# Patient Record
Sex: Male | Born: 1950 | Race: Black or African American | Hispanic: No | Smoking: Never smoker
Health system: Southern US, Community
[De-identification: ages and names within clinical notes are randomized; demographics above are authoritative.]

## PROBLEM LIST (undated history)

## (undated) DIAGNOSIS — N400 Enlarged prostate without lower urinary tract symptoms: Secondary | ICD-10-CM

## (undated) DIAGNOSIS — E119 Type 2 diabetes mellitus without complications: Secondary | ICD-10-CM

## (undated) DIAGNOSIS — I10 Essential (primary) hypertension: Secondary | ICD-10-CM

---

## 2016-11-25 HISTORY — PX: CORONARY ARTERY BYPASS GRAFT: SHX141

## 2017-01-02 ENCOUNTER — Inpatient Hospital Stay (HOSPITAL_COMMUNITY)
Admission: EM | Admit: 2017-01-02 | Discharge: 2017-01-09 | DRG: 304 | Disposition: A | Discharge status: Home / Self Care | Payer: Medicare Other | Attending: Internal Medicine | Admitting: Internal Medicine

## 2017-01-02 ENCOUNTER — Encounter (HOSPITAL_COMMUNITY): Payer: Self-pay

## 2017-01-02 ENCOUNTER — Emergency Department (HOSPITAL_COMMUNITY): Payer: Medicare Other

## 2017-01-02 DIAGNOSIS — Z823 Family history of stroke: Secondary | ICD-10-CM | POA: Diagnosis not present

## 2017-01-02 DIAGNOSIS — I5032 Chronic diastolic (congestive) heart failure: Secondary | ICD-10-CM | POA: Diagnosis present

## 2017-01-02 DIAGNOSIS — N179 Acute kidney failure, unspecified: Secondary | ICD-10-CM | POA: Diagnosis present

## 2017-01-02 DIAGNOSIS — Z79899 Other long term (current) drug therapy: Secondary | ICD-10-CM

## 2017-01-02 DIAGNOSIS — Z7984 Long term (current) use of oral hypoglycemic drugs: Secondary | ICD-10-CM

## 2017-01-02 DIAGNOSIS — E1165 Type 2 diabetes mellitus with hyperglycemia: Secondary | ICD-10-CM | POA: Diagnosis present

## 2017-01-02 DIAGNOSIS — F19930 Other psychoactive substance use, unspecified with withdrawal, uncomplicated: Secondary | ICD-10-CM

## 2017-01-02 DIAGNOSIS — I130 Hypertensive heart and chronic kidney disease with heart failure and stage 1 through stage 4 chronic kidney disease, or unspecified chronic kidney disease: Secondary | ICD-10-CM | POA: Diagnosis present

## 2017-01-02 DIAGNOSIS — Z951 Presence of aortocoronary bypass graft: Secondary | ICD-10-CM | POA: Diagnosis not present

## 2017-01-02 DIAGNOSIS — E872 Acidosis, unspecified: Secondary | ICD-10-CM | POA: Diagnosis present

## 2017-01-02 DIAGNOSIS — R1110 Vomiting, unspecified: Secondary | ICD-10-CM | POA: Diagnosis not present

## 2017-01-02 DIAGNOSIS — I2510 Atherosclerotic heart disease of native coronary artery without angina pectoris: Secondary | ICD-10-CM | POA: Diagnosis present

## 2017-01-02 DIAGNOSIS — F19230 Other psychoactive substance dependence with withdrawal, uncomplicated: Secondary | ICD-10-CM

## 2017-01-02 DIAGNOSIS — D649 Anemia, unspecified: Secondary | ICD-10-CM | POA: Diagnosis present

## 2017-01-02 DIAGNOSIS — Z8249 Family history of ischemic heart disease and other diseases of the circulatory system: Secondary | ICD-10-CM | POA: Diagnosis not present

## 2017-01-02 DIAGNOSIS — N183 Chronic kidney disease, stage 3 unspecified: Secondary | ICD-10-CM | POA: Diagnosis present

## 2017-01-02 DIAGNOSIS — K226 Gastro-esophageal laceration-hemorrhage syndrome: Secondary | ICD-10-CM | POA: Diagnosis present

## 2017-01-02 DIAGNOSIS — E876 Hypokalemia: Secondary | ICD-10-CM | POA: Diagnosis present

## 2017-01-02 DIAGNOSIS — Z888 Allergy status to other drugs, medicaments and biological substances status: Secondary | ICD-10-CM | POA: Diagnosis not present

## 2017-01-02 DIAGNOSIS — I160 Hypertensive urgency: Secondary | ICD-10-CM | POA: Diagnosis present

## 2017-01-02 DIAGNOSIS — K2970 Gastritis, unspecified, without bleeding: Secondary | ICD-10-CM | POA: Diagnosis present

## 2017-01-02 DIAGNOSIS — K5900 Constipation, unspecified: Secondary | ICD-10-CM | POA: Diagnosis present

## 2017-01-02 DIAGNOSIS — Z833 Family history of diabetes mellitus: Secondary | ICD-10-CM | POA: Diagnosis not present

## 2017-01-02 DIAGNOSIS — E1122 Type 2 diabetes mellitus with diabetic chronic kidney disease: Secondary | ICD-10-CM | POA: Diagnosis present

## 2017-01-02 DIAGNOSIS — R110 Nausea: Secondary | ICD-10-CM

## 2017-01-02 DIAGNOSIS — Z841 Family history of disorders of kidney and ureter: Secondary | ICD-10-CM

## 2017-01-02 DIAGNOSIS — R000 Tachycardia, unspecified: Secondary | ICD-10-CM

## 2017-01-02 DIAGNOSIS — R112 Nausea with vomiting, unspecified: Secondary | ICD-10-CM | POA: Diagnosis present

## 2017-01-02 DIAGNOSIS — J310 Chronic rhinitis: Secondary | ICD-10-CM | POA: Diagnosis present

## 2017-01-02 DIAGNOSIS — N400 Enlarged prostate without lower urinary tract symptoms: Secondary | ICD-10-CM | POA: Diagnosis present

## 2017-01-02 DIAGNOSIS — Z7902 Long term (current) use of antithrombotics/antiplatelets: Secondary | ICD-10-CM | POA: Diagnosis not present

## 2017-01-02 DIAGNOSIS — J9811 Atelectasis: Secondary | ICD-10-CM | POA: Diagnosis present

## 2017-01-02 DIAGNOSIS — I10 Essential (primary) hypertension: Secondary | ICD-10-CM

## 2017-01-02 HISTORY — DX: Benign prostatic hyperplasia without lower urinary tract symptoms: N40.0

## 2017-01-02 HISTORY — DX: Essential (primary) hypertension: I10

## 2017-01-02 HISTORY — DX: Type 2 diabetes mellitus without complications: E11.9

## 2017-01-02 LAB — URINALYSIS, ROUTINE W REFLEX MICROSCOPIC
Bacteria, UA: NONE SEEN
Bilirubin Urine: NEGATIVE
Glucose, UA: >=500 mg/dL — AB
KETONES UR: 20 mg/dL — AB
Leukocytes, UA: NEGATIVE
Nitrite: NEGATIVE
PH: 5 (ref 5.0–8.0)
Protein, ur: >=300 mg/dL — AB
Specific Gravity, Urine: 1.016 (ref 1.005–1.030)
Squamous Epithelial / LPF: NONE SEEN

## 2017-01-02 LAB — COMPREHENSIVE METABOLIC PANEL
ALK PHOS: 81 U/L (ref 38–126)
ALT: 14 U/L — ABNORMAL LOW (ref 17–63)
ANION GAP: 23 — AB (ref 5–15)
AST: 27 U/L (ref 15–41)
Albumin: 4.2 g/dL (ref 3.5–5.0)
BUN: 16 mg/dL (ref 6–20)
CALCIUM: 10 mg/dL (ref 8.9–10.3)
CO2: 19 mmol/L — AB (ref 22–32)
Chloride: 98 mmol/L — ABNORMAL LOW (ref 101–111)
Creatinine, Ser: 1.89 mg/dL — ABNORMAL HIGH (ref 0.61–1.24)
GFR calc non Af Amer: 35 mL/min — ABNORMAL LOW (ref >60)
GFR, EST AFRICAN AMERICAN: 41 mL/min — AB (ref >60)
Glucose, Bld: 446 mg/dL — ABNORMAL HIGH (ref 65–99)
POTASSIUM: 3.6 mmol/L (ref 3.5–5.1)
SODIUM: 140 mmol/L (ref 135–145)
Total Bilirubin: 1.1 mg/dL (ref 0.3–1.2)
Total Protein: 8.9 g/dL — ABNORMAL HIGH (ref 6.5–8.1)

## 2017-01-02 LAB — CBC
HCT: 36.3 % — ABNORMAL LOW (ref 39.0–52.0)
HEMOGLOBIN: 12.2 g/dL — AB (ref 13.0–17.0)
MCH: 28.8 pg (ref 26.0–34.0)
MCHC: 33.6 g/dL (ref 30.0–36.0)
MCV: 85.6 fL (ref 78.0–100.0)
Platelets: 428 10*3/uL — ABNORMAL HIGH (ref 150–400)
RBC: 4.24 MIL/uL (ref 4.22–5.81)
RDW: 15.3 % (ref 11.5–15.5)
WBC: 9.6 10*3/uL (ref 4.0–10.5)

## 2017-01-02 LAB — I-STAT TROPONIN, ED: Troponin i, poc: 0.03 ng/mL (ref 0.00–0.08)

## 2017-01-02 LAB — CBG MONITORING, ED: GLUCOSE-CAPILLARY: 294 mg/dL — AB (ref 65–99)

## 2017-01-02 LAB — I-STAT CG4 LACTIC ACID, ED
LACTIC ACID, VENOUS: 2.12 mmol/L — AB (ref 0.5–1.9)
Lactic Acid, Venous: 4.3 mmol/L (ref 0.5–1.9)

## 2017-01-02 MED ORDER — SODIUM CHLORIDE 0.9 % IV SOLN: 250.0000 mL | INTRAVENOUS | Status: DC | PRN

## 2017-01-02 MED ORDER — LABETALOL HCL 5 MG/ML IV SOLN
10.0000 mg | INTRAVENOUS | Status: DC | PRN
  Administered 2017-01-02: 10 mg via INTRAVENOUS
  Administered 2017-01-03 (×2): 20 mg via INTRAVENOUS
  Filled 2017-01-02 (×3): qty 4

## 2017-01-02 MED ORDER — LACTATED RINGERS IV SOLN
INTRAVENOUS | Status: DC
  Administered 2017-01-02: 17:00:00 via INTRAVENOUS
  Filled 2017-01-02: qty 1000

## 2017-01-02 MED ORDER — SODIUM CHLORIDE 0.9 % IV BOLUS (SEPSIS)
1000.0000 mL | Freq: Once | INTRAVENOUS | Status: AC
  Administered 2017-01-02: 1000 mL via INTRAVENOUS

## 2017-01-02 MED ORDER — SODIUM CHLORIDE 0.9 % IV SOLN
INTRAVENOUS | Status: DC
  Administered 2017-01-02: 20:00:00 via INTRAVENOUS

## 2017-01-02 MED ORDER — CLONIDINE HCL 0.2 MG PO TABS
0.2000 mg | ORAL_TABLET | Freq: Three times a day (TID) | ORAL | Status: DC
Start: 2017-01-02 — End: 2017-01-04
  Administered 2017-01-02 – 2017-01-04 (×4): 0.2 mg via ORAL
  Filled 2017-01-02 (×6): qty 1

## 2017-01-02 MED ORDER — INSULIN REGULAR BOLUS VIA INFUSION
0.0000 [IU] | Freq: Three times a day (TID) | INTRAVENOUS | Status: DC
  Filled 2017-01-02: qty 10

## 2017-01-02 MED ORDER — LACTATED RINGERS IV BOLUS (SEPSIS)
1000.0000 mL | Freq: Once | INTRAVENOUS | Status: AC
  Administered 2017-01-02: 1000 mL via INTRAVENOUS

## 2017-01-02 MED ORDER — METOPROLOL TARTRATE 5 MG/5ML IV SOLN
5.0000 mg | Freq: Once | INTRAVENOUS | Status: AC
  Administered 2017-01-02: 5 mg via INTRAVENOUS
  Filled 2017-01-02: qty 5

## 2017-01-02 MED ORDER — HYDRALAZINE HCL 20 MG/ML IJ SOLN
10.0000 mg | Freq: Once | INTRAMUSCULAR | Status: DC
Start: 2017-01-02 — End: 2017-01-02
  Filled 2017-01-02: qty 1

## 2017-01-02 MED ORDER — DEXTROSE 50 % IV SOLN: 25.0000 mL | INTRAVENOUS | Status: DC | PRN

## 2017-01-02 MED ORDER — LACTATED RINGERS IV BOLUS (SEPSIS): 1000.0000 mL | Freq: Once | INTRAVENOUS | Status: DC

## 2017-01-02 MED ORDER — SENNOSIDES-DOCUSATE SODIUM 8.6-50 MG PO TABS
2.0000 | ORAL_TABLET | Freq: Every evening | ORAL | Status: DC | PRN
  Filled 2017-01-02 (×2): qty 2

## 2017-01-02 MED ORDER — ACETAMINOPHEN 325 MG PO TABS: 650.0000 mg | ORAL_TABLET | Freq: Four times a day (QID) | ORAL | Status: DC | PRN

## 2017-01-02 MED ORDER — SODIUM CHLORIDE 0.9 % IV SOLN
8.0000 mg/h | INTRAVENOUS | Status: DC
  Administered 2017-01-02 – 2017-01-03 (×2): 8 mg/h via INTRAVENOUS
  Filled 2017-01-02 (×3): qty 80

## 2017-01-02 MED ORDER — SODIUM CHLORIDE 0.9 % IV SOLN
INTRAVENOUS | Status: DC
  Administered 2017-01-03: 01:00:00 via INTRAVENOUS

## 2017-01-02 MED ORDER — ONDANSETRON HCL 4 MG/2ML IJ SOLN
4.0000 mg | Freq: Once | INTRAMUSCULAR | Status: AC
  Administered 2017-01-02: 4 mg via INTRAVENOUS
  Filled 2017-01-02: qty 2

## 2017-01-02 MED ORDER — METOPROLOL TARTRATE 25 MG PO TABS: 100.0000 mg | ORAL_TABLET | Freq: Two times a day (BID) | ORAL | Status: DC

## 2017-01-02 MED ORDER — DEXTROSE-NACL 5-0.45 % IV SOLN: INTRAVENOUS | Status: DC

## 2017-01-02 MED ORDER — LISINOPRIL 5 MG PO TABS: 5.0000 mg | ORAL_TABLET | Freq: Every day | ORAL | Status: DC

## 2017-01-02 MED ORDER — SODIUM CHLORIDE 0.9 % IV SOLN
INTRAVENOUS | Status: DC
  Filled 2017-01-02: qty 1

## 2017-01-02 MED ORDER — INSULIN ASPART 100 UNIT/ML ~~LOC~~ SOLN
0.0000 [IU] | SUBCUTANEOUS | Status: DC
  Administered 2017-01-03: 1 [IU] via SUBCUTANEOUS
  Administered 2017-01-03: 7 [IU] via SUBCUTANEOUS
  Administered 2017-01-03: 5 [IU] via SUBCUTANEOUS

## 2017-01-02 MED ORDER — LORATADINE 10 MG PO TABS
10.0000 mg | ORAL_TABLET | Freq: Every day | ORAL | Status: DC | PRN
  Filled 2017-01-02: qty 1

## 2017-01-02 MED ORDER — FLUTICASONE PROPIONATE 50 MCG/ACT NA SUSP
1.0000 | Freq: Every day | NASAL | Status: DC | PRN
  Filled 2017-01-02: qty 16

## 2017-01-02 MED ORDER — SODIUM CHLORIDE 0.9 % IV SOLN
80.0000 mg | Freq: Once | INTRAVENOUS | Status: AC
  Administered 2017-01-02: 80 mg via INTRAVENOUS
  Filled 2017-01-02: qty 80

## 2017-01-02 MED ORDER — ONDANSETRON HCL 4 MG/2ML IJ SOLN
4.0000 mg | Freq: Four times a day (QID) | INTRAMUSCULAR | Status: DC | PRN
  Administered 2017-01-04 (×2): 4 mg via INTRAVENOUS
  Filled 2017-01-02 (×4): qty 2

## 2017-01-02 MED ORDER — CLONIDINE HCL 0.2 MG PO TABS: 0.3000 mg | ORAL_TABLET | Freq: Once | ORAL | Status: DC

## 2017-01-02 MED ORDER — SODIUM CHLORIDE 0.9% FLUSH
3.0000 mL | Freq: Two times a day (BID) | INTRAVENOUS | Status: DC
  Administered 2017-01-04 – 2017-01-08 (×10): 3 mL via INTRAVENOUS

## 2017-01-02 MED ORDER — LABETALOL HCL 5 MG/ML IV SOLN
0.5000 mg/min | INTRAVENOUS | Status: DC
  Administered 2017-01-02: 2 mg/min via INTRAVENOUS
  Administered 2017-01-03: 0.5 mg/min via INTRAVENOUS
  Administered 2017-01-03: 2.5 mg/min via INTRAVENOUS
  Filled 2017-01-02 (×3): qty 100

## 2017-01-02 MED ORDER — LABETALOL HCL 5 MG/ML IV SOLN
10.0000 mg | INTRAVENOUS | Status: DC | PRN
  Administered 2017-01-02: 10 mg via INTRAVENOUS
  Filled 2017-01-02: qty 4

## 2017-01-02 MED ORDER — HYDRALAZINE HCL 50 MG PO TABS: 50.0000 mg | ORAL_TABLET | Freq: Three times a day (TID) | ORAL | Status: DC

## 2017-01-02 MED ORDER — METOPROLOL TARTRATE 5 MG/5ML IV SOLN: 5.0000 mg | Freq: Once | INTRAVENOUS | Status: DC

## 2017-01-02 MED ORDER — HYDRALAZINE HCL 20 MG/ML IJ SOLN
10.0000 mg | Freq: Once | INTRAMUSCULAR | Status: DC
  Filled 2017-01-02: qty 1

## 2017-01-02 MED ORDER — HYDRALAZINE HCL 50 MG PO TABS
50.0000 mg | ORAL_TABLET | Freq: Three times a day (TID) | ORAL | Status: DC
  Administered 2017-01-03: 50 mg via ORAL
  Filled 2017-01-02 (×2): qty 1

## 2017-01-02 NOTE — Progress Notes (Addendum)
Now failed Labetalol 10mg  IVPush x2, DBP still 130s, HR 117.  Spoke with pharmd.  Will try labetalol gtt, and resuming clonadine but at lower dose of 0.2 TID (instead of 0.3 TID which was stopped 4 days ago).

## 2017-01-02 NOTE — ED Triage Notes (Signed)
Pt had CABG done at [facility] in May. He has been vomiting X2 days. Pt is very tachycardic in triage. Appears weak in appearance.

## 2017-01-02 NOTE — ED Notes (Signed)
Per daughter pt has been sipping water with no further emesis.  Per Dr. Julian ReilGardner pt is to have PO Clonidine them possibly d/c  Labetalol drip.

## 2017-01-02 NOTE — Consult Note (Signed)
PULMONARY / CRITICAL CARE MEDICINE   Name: Brian Grimes MRN: [id number] DOB: May 27, 1951    ADMISSION DATE:  01/02/2017 CONSULTATION DATE:  01/02/2017  REFERRING MD:  Dr. 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 Upon arrival to ED patient HR 146, BP 201/134, WBC 9.6, Lactic Acid 4.3, Glucose 446, Anion Gap 23, +Ketones. In ED had one reported episode of coffee ground emesis. Patient reports that he went to CT Surgery Follow up at [facility] on 6/21 where his BP was 82/47, he was instructed to stop clonidine and check BP before taking metoprolol. However, for the last 2 days patient has been unable to take BP medications due to nausea. In ED given 2L LR and 5 mg metoprolol, 20 mg Labetolol, and 0.2 mg Clonidine, and initially admitted to [facility], however BP still uncontrolled so placed on Labetalol gtt. PCCM to transfer to ICU.   PAST MEDICAL HISTORY :  He  has a past medical history of BPH (benign prostatic hyperplasia); Diabetes mellitus without complication (HCC); and Hypertension.  PAST SURGICAL HISTORY: He  has a past surgical history that includes Coronary artery bypass graft (11/25/2016).  Allergies  Allergen Reactions  . Lipitor [Atorvastatin] Other (See Comments)    Muscle aches (legs)  . Pollen Extract Other (See Comments)    Runny nose, runny eyes, sneezing, etc..  . Pravastatin Other (See Comments)    Muscle aches (legs)    No current facility-administered medications on file prior to encounter.    No current outpatient prescriptions on file prior to encounter.    FAMILY HISTORY:  His indicated that the status of his mother is unknown. He indicated that the status of his father is unknown. He indicated that the status of his brother is unknown.     SOCIAL HISTORY: He  reports that he has never smoked. He has never used smokeless tobacco. He reports that he does not drink alcohol.  REVIEW OF SYSTEMS:   All negative; except for those that are bolded, which indicate positives.  Constitutional: weight loss, weight gain, night sweats, fevers, chills, fatigue, weakness.  HEENT: headaches, sore throat, sneezing, nasal congestion, post nasal drip, difficulty swallowing, tooth/dental problems, visual complaints, visual changes, ear aches. Neuro: difficulty with speech, weakness, numbness, ataxia. CV:  chest pain, orthopnea, PND, swelling in lower extremities, dizziness, palpitations, syncope.  Resp: cough, hemoptysis, dyspnea, wheezing. GI: heartburn, indigestion, abdominal pain, nausea, vomiting, diarrhea, constipation, change in bowel habits, loss of appetite, hematemesis, melena, hematochezia.  GU: dysuria, change in color of urine, urgency or frequency, flank pain, hematuria. MSK: joint pain or swelling, decreased range of motion. Psych: change in mood or affect, depression, anxiety, suicidal ideations, homicidal ideations. Skin: rash, itching, bruising.   SUBJECTIVE:  Denies Chest/ABD Pain.   VITAL SIGNS: BP (!) 183/122   Pulse (!) 110   Temp 97.5 F (36.4 C) (Oral)   Resp (!) 21   SpO2 98%   HEMODYNAMICS:    VENTILATOR SETTINGS:    INTAKE / OUTPUT: I/O last 3 completed shifts: In: 2000 [I.V.:2000] Out: -   PHYSICAL EXAMINATION: General:  Adult male, no distress  Neuro:  Alert, oriented, grossly intact   HEENT: Dry MM Cardiovascular:  Tachy, no MRG Lungs:  Clear  breath sounds, non-labored  Abdomen:  Non-tender, active bowel sounds  Musculoskeletal:  -edema  Skin:  Warm, dry, intact   LABS:  BMET  Recent Labs Lab 01/02/17 1428  NA 140  K 3.6  CL 98*  CO2 19*  BUN 16  CREATININE 1.89*  GLUCOSE 446*    Electrolytes  Recent Labs Lab 01/02/17 1428  CALCIUM 10.0    CBC  Recent Labs Lab  01/02/17 1428  WBC 9.6  HGB 12.2*  HCT 36.3*  PLT 428*    Coag's No results for input(s): APTT, INR in the last 168 hours.  Sepsis Markers  Recent Labs Lab 01/02/17 1427 01/02/17 1838  LATICACIDVEN 4.30* 2.12*    ABG No results for input(s): PHART, PCO2ART, PO2ART in the last 168 hours.  Liver Enzymes  Recent Labs Lab 01/02/17 1428  AST 27  ALT 14*  ALKPHOS 81  BILITOT 1.1  ALBUMIN 4.2    Cardiac Enzymes No results for input(s): TROPONINI, PROBNP in the last 168 hours.  Glucose  Recent Labs Lab 01/02/17 2012  GLUCAP 294*    Imaging Dg Chest Portable 1 View  Result Date: 01/02/2017 CLINICAL DATA:  Two days of nausea and vomiting. Tachycardia. History of diabetes and coronary artery disease. CABG in May 2018. EXAM: PORTABLE CHEST 1 VIEW COMPARISON:  None in PACs FINDINGS: The left lung is well-expanded and clear. On the right there is mild volume loss. Linear increased density in the right perihilar and infrahilar region is present. There is blunting of the right lateral costophrenic angle. The heart and pulmonary vascularity are normal. There is calcification in the wall of the aortic arch. The sternal wires are intact. IMPRESSION: Right perihilar and basilar subsegmental atelectasis. Small right pleural effusion versus pleural thickening. No pulmonary edema. Previous CABG. Thoracic aortic atherosclerosis. Electronically Signed   By: David  SwazilandJordan M.D.   On: 01/02/2017 16:05    STUDIES:  EKG 6/25 >> ST 150, prolonged QTc 530 PCXR 6/25 >> right perihilar and basilar subsegmental atelectasis; small right pleural effusion vs pleural thickening; previous CABG; thoracic aortic atherosclerosis   [facility] >>  ANTIBIOTICS: n/a  SIGNIFICANT EVENTS: 6/25  Admit   LINES/TUBES: PIV   DISCUSSION: [age] yoM with recent CABG in May presented with N/V x 2 days and HTN urgency.    ASSESSMENT / PLAN:  PULMONARY A: Right atelectasis P:   Pulmonary  hygiene with IS  CARDIOVASCULAR A:  HTN urgency from no home HTN meds for 2 days Tachycardia - ? Rebound from stopping clonidine 6/21 vs QTc prolongation Hx [facility]) P:  ICU monitoring Trend troponin  Labetalol gtt for goal reduction of 25%- SBP 150-160 Continue conidine 0.2 mg TID Continue apresoline 50 mg q 8 EKG in am  Avoid QTc prolonging meds Holding plavix   RENAL A:   AKI (baseline sCr 1.5) AGMA/ Lactic acidosis - ddx to N/V, metformin, ?hypovolemia 2/2 decreased oral intake  CKD stage III - s/p 2 Liter NS in ED  P:   NS at 75 ml/hr Trend BMP / urinary output Replace electrolytes as indicated Avoid nephrotoxic agents, ensure adequate renal perfusion Trending Lactic Acid   GASTROINTESTINAL A:   R/o UGIB 2/2 small amount of coffee ground emesis vs ? Mallory weiss tear - normal LFTs P:   NPO except for meds On protonix gtt for now pending Gastroccult  Consider GI consult in am  Caution with zofran with prolonged QTc  HEMATOLOGIC A:   Anemia- mild  P:  Trend CBC SCDs  INFECTIOUS A:   No clear source- afebrile, normal WBC  P:   Follow BC PCT for completeness   ENDOCRINE A:   DM - w/ AG/ +ketones P:   SSI sensitive CBG q 4 Hold Metformin   NEUROLOGIC A:   No acute issues  P:   Monitor    FAMILY  - Updates: Family updated at bedside   - Inter-disciplinary family meet or Palliative Care meeting due by:  01/09/2017   CC Time: 45 minutes   Pulmonary and Critical Care Medicine [facility] Pager: [phone]  01/02/2017, 10:36 PM

## 2017-01-02 NOTE — ED Provider Notes (Signed)
[facility]-EMERGENCY DEPT Provider Note   CSN: [id number] Arrival date & time: 01/02/17  1354     History   Chief Complaint Chief Complaint  Patient presents with  . Emesis    HPI Brian Grimes is a 66 y.o. male.  The history is provided by the patient, the spouse and medical records. No language interpreter was used.  Emesis   This is a new problem. The current episode started 2 days ago. The problem occurs 5 to 10 times per day. The problem has not changed since onset.There has been no fever. Pertinent negatives include no abdominal pain, no arthralgias, no chills, no cough, no diarrhea and no fever.    Past Medical History:  Diagnosis Date  . BPH (benign prostatic hyperplasia)   . Diabetes mellitus without complication (HCC)   . Hypertension     Patient Active Problem List   Diagnosis Date Noted  . CKD stage 3 due to type 2 diabetes mellitus (HCC) 01/02/2017  . Hypertensive urgency 01/02/2017  . Nausea & vomiting 01/02/2017  . Lactic acidosis 01/02/2017  . Hyperglycemia due to type 2 diabetes mellitus (HCC) 01/02/2017  . CAD (coronary artery disease), native coronary artery 01/02/2017    Past Surgical History:  Procedure Laterality Date  . CORONARY ARTERY BYPASS GRAFT  11/25/2016       Home Medications    Prior to Admission medications   Medication Sig Start Date End Date Taking? Authorizing Provider  clopidogrel (PLAVIX) 75 MG tablet Take 75 mg by mouth daily.   Yes [provider]  fluticasone (FLONASE ALLERGY RELIEF) 50 MCG/ACT nasal spray Place 1 spray into both nostrils daily as needed for allergies or rhinitis.   Yes [provider]  glipiZIDE (GLUCOTROL) 5 MG tablet Take 2.5 mg by mouth daily before breakfast.   Yes [provider]  hydrALAZINE (APRESOLINE) 50 MG tablet Take 50 mg by mouth every 8 (eight) hours.   Yes [provider]  lisinopril (PRINIVIL,ZESTRIL) 5 MG tablet Take 5 mg by mouth daily.   Yes [provider]  loratadine (CLARITIN) 10 MG tablet Take 10 mg by mouth daily as needed for allergies or rhinitis.   Yes [provider]  metFORMIN (GLUCOPHAGE) 1000 MG tablet Take 1,000 mg by mouth 2 (two) times daily with a meal.   Yes [provider]  metoprolol tartrate (LOPRESSOR) 100 MG tablet Take 100 mg by mouth 2 (two) times daily.   Yes [provider]  sennosides-docusate sodium (SENOKOT-S) 8.6-50 MG tablet Take 2 tablets by mouth at bedtime as needed for constipation.   Yes [provider]    Family History Family History  Problem Relation Age of Onset  . Diabetes Mother   . Kidney disease Mother   . Hypertension Mother   . Stroke Father   . Kidney disease Brother     Social History Social History  Substance Use Topics  . Smoking status: Never Smoker  . Smokeless tobacco: Never Used  . Alcohol use No     Allergies   Lipitor [atorvastatin]; Pollen extract; and Pravastatin   Review of Systems Review of Systems  Constitutional: Positive for fatigue. Negative for chills and fever.  HENT: Negative for ear pain and sore throat.   Eyes: Negative for pain and visual disturbance.  Respiratory: Negative for cough and shortness of breath.   Cardiovascular: Negative for chest pain and palpitations.  Gastrointestinal: Positive for vomiting. Negative for abdominal pain and diarrhea.  Genitourinary: Negative for  dysuria and hematuria.  Musculoskeletal: Negative for arthralgias and back pain.  Skin: Negative for color change and rash.  Neurological: Negative for seizures and syncope.  All other systems reviewed and are negative.    Physical Exam Updated Vital Signs BP (!) 150/102   Pulse 98   Temp 99.9 F (37.7 C) (Oral)   Resp (!) 25   Ht 6\' 2"  (1.88 m)   Wt 87.2 kg (192 lb 3.9 oz)   SpO2 99%   BMI 24.68 kg/m   Physical Exam  Constitutional: He appears well-developed. He appears ill.  HENT:  Head: Normocephalic and  atraumatic.  Eyes: Conjunctivae are normal.  Neck: Neck supple.  Cardiovascular: Regular rhythm.  Tachycardia present.   No murmur heard. Pulmonary/Chest: Effort normal and breath sounds normal. No respiratory distress. He has no wheezes.  Abdominal: Soft. He exhibits no distension. There is no tenderness. There is no guarding.  Musculoskeletal: He exhibits no edema.  Neurological: He is alert. No cranial nerve deficit. Coordination normal.  5/5 motor strength and intact sensation in all extremities. Intact bilateral finger-to-nose coordination  Skin: Skin is warm and dry.  Nursing note and vitals reviewed.    ED Treatments / Results  Labs (all labs ordered are listed, but only abnormal results are displayed) Labs Reviewed  COMPREHENSIVE METABOLIC PANEL - Abnormal; Notable for the following:       Result Value   Chloride 98 (*)    CO2 19 (*)    Glucose, Bld 446 (*)    Creatinine, Ser 1.89 (*)    Total Protein 8.9 (*)    ALT 14 (*)    GFR calc non Af Amer 35 (*)    GFR calc Af Amer 41 (*)    Anion gap 23 (*)    All other components within normal limits  CBC - Abnormal; Notable for the following:    Hemoglobin 12.2 (*)    HCT 36.3 (*)    Platelets 428 (*)    All other components within normal limits  URINALYSIS, ROUTINE W REFLEX MICROSCOPIC - Abnormal; Notable for the following:    Color, Urine STRAW (*)    Glucose, UA >=500 (*)    Hgb urine dipstick MODERATE (*)    Ketones, ur 20 (*)    Protein, ur >=300 (*)    All other components within normal limits  CBC - Abnormal; Notable for the following:    RBC 4.01 (*)    Hemoglobin 11.4 (*)    HCT 34.5 (*)    All other components within normal limits  MAGNESIUM - Abnormal; Notable for the following:    Magnesium 1.3 (*)    All other components within normal limits  TROPONIN I - Abnormal; Notable for the following:    Troponin I 0.08 (*)    All other components within normal limits  GLUCOSE, CAPILLARY - Abnormal; Notable  for the following:    Glucose-Capillary 329 (*)    All other components within normal limits  I-STAT CG4 LACTIC ACID, ED - Abnormal; Notable for the following:    Lactic Acid, Venous 4.30 (*)    All other components within normal limits  I-STAT CG4 LACTIC ACID, ED - Abnormal; Notable for the following:    Lactic Acid, Venous 2.12 (*)    All other components within normal limits  CBG MONITORING, ED - Abnormal; Notable for the following:    Glucose-Capillary 294 (*)    All other components within normal limits  POCT GASTRIC  OCCULT BLOOD (1-CARD TO LAB) - Abnormal; Notable for the following:    Occult Blood, Gastric POSITIVE (*)    All other components within normal limits  CULTURE, BLOOD (ROUTINE X 2)  CULTURE, BLOOD (ROUTINE X 2)  URINE CULTURE  MRSA PCR SCREENING  PROCALCITONIN  LACTIC ACID, PLASMA  CBC  BASIC METABOLIC PANEL  TROPONIN I  TROPONIN I  MAGNESIUM  PHOSPHORUS  PROCALCITONIN  OCCULT BLOOD GASTRIC / DUODENUM (SPECIMEN CUP)  Rosezena Sensor, ED    EKG  EKG Interpretation  Date/Time:  Monday January 02 2017 14:19:52 EDT Ventricular Rate:  150 PR Interval:    QRS Duration: 69 QT Interval:  326 QTC Calculation: 515 R Axis:   54 Text Interpretation:  Sinus tachycardia Ventricular premature complex Aberrant complex Probable LVH with secondary repol abnrm Baseline wander Abnormal ekg Confirmed by Gerhard Munch [phone]) on 01/02/2017 3:44:21 PM       Radiology Dg Chest Portable 1 View  Result Date: 01/02/2017 CLINICAL DATA:  Two days of nausea and vomiting. Tachycardia. History of diabetes and coronary artery disease. CABG in May 2018. EXAM: PORTABLE CHEST 1 VIEW COMPARISON:  None in PACs FINDINGS: The left lung is well-expanded and clear. On the right there is mild volume loss. Linear increased density in the right perihilar and infrahilar region is present. There is blunting of the right lateral costophrenic angle. The heart and pulmonary vascularity are normal.  There is calcification in the wall of the aortic arch. The sternal wires are intact. IMPRESSION: Right perihilar and basilar subsegmental atelectasis. Small right pleural effusion versus pleural thickening. No pulmonary edema. Previous CABG. Thoracic aortic atherosclerosis. Electronically Signed   By: David  [place]az[place] M.D.   On: 01/02/2017 16:05    Procedures Procedures (including critical care time)  Medications Ordered in ED Medications  pantoprazole (PROTONIX) 80 mg in sodium chloride 0.9 % 250 mL (0.32 mg/mL) infusion (8 mg/hr Intravenous Rate/Dose Verify 01/03/17 0000)  dextrose 50 % solution 25 mL (not administered)  ondansetron (ZOFRAN) injection 4 mg (not administered)  acetaminophen (TYLENOL) tablet 650 mg (not administered)  sodium chloride flush (NS) 0.9 % injection 3 mL (not administered)  lisinopril (PRINIVIL,ZESTRIL) tablet 5 mg (not administered)  senna-docusate (Senokot-S) tablet 2 tablet (not administered)  loratadine (CLARITIN) tablet 10 mg (not administered)  fluticasone (FLONASE) 50 MCG/ACT nasal spray 1 spray (not administered)  labetalol (NORMODYNE,TRANDATE) injection 10-20 mg (10 mg Intravenous Given 01/02/17 2020)  insulin aspart (novoLOG) injection 0-9 Units (7 Units Subcutaneous Given 01/03/17 0019)  0.9 %  sodium chloride infusion ( Intravenous New Bag/Given 01/03/17 0121)  hydrALAZINE (APRESOLINE) tablet 50 mg (not administered)  cloNIDine (CATAPRES) tablet 0.2 mg (0.2 mg Oral Given 01/02/17 2215)  labetalol (NORMODYNE,TRANDATE) 500 mg in dextrose 5 % 125 mL (4 mg/mL) infusion (2.5 mg/min Intravenous New Bag/Given 01/03/17 0116)  0.9 %  sodium chloride infusion (not administered)  magnesium sulfate IVPB 2 g 50 mL (not administered)  sodium chloride 0.9 % bolus 1,000 mL (0 mLs Intravenous Stopped 01/02/17 1638)  ondansetron (ZOFRAN) injection 4 mg (4 mg Intravenous Given 01/02/17 1526)  lactated ringers bolus 1,000 mL (0 mLs Intravenous Stopped 01/02/17 1639)  pantoprazole  (PROTONIX) 80 mg in sodium chloride 0.9 % 100 mL IVPB (0 mg Intravenous Stopped 01/02/17 1743)  metoprolol tartrate (LOPRESSOR) injection 5 mg (5 mg Intravenous Given 01/02/17 1744)  ondansetron (ZOFRAN) injection 4 mg (4 mg Intravenous Given 01/02/17 1957)     Initial Impression / Assessment and Plan / ED Course  I  have reviewed the triage vital signs and the nursing notes.  Pertinent labs & imaging results that were available during my care of the patient were reviewed by me and considered in my medical decision making (see chart for details).     66 year old male history of diabetes, hypertension, CAD S/P CABG who presents with family with 2 days of nausea and vomiting.  He is found to be hypertensive and tachycardic, likely secondary to clonidine withdrawal and recent discontinuation of hypertensive medications.  Patient endorses 2 days of emesis.  Today, vomitus appeared to have brown tinge.  Denies previous episodes of GI bleeding.  Does not currently take anticoagulation.  He recently had CABG in May 2018 at [facility].  He was seen in CT surgery clinic 4 days ago and noted to be hypotensive to 80s/40s.  He was immediately taken off his clonidine 0.3 mg 3 times daily and evening dose of metoprolol.  He denies any fevers, cough, diarrhea, chest pain, shortness breath, dysuria, or rash.  AF, tachycardic 150s, hypertensive 190s/100s. Lungs CTAB. Abdomen soft, benign throughout.  Initial lactic acid 4.30. IVF ordered. HR remains in 150s. No obvious source of infection. Protonix given for brown discoloration of emesis. Repeat lactic acid 2.1. Crt 1.89, uptrending from OSH baseline 1.3-1.6.   Metoprolol given for persistent tachycardia and hypertension. No significant improvement. Suspect withdrawal from abrupt discontinuation of clonidine several days ago. Patient admitted for further management and evaluation. Pt stable at time of transfer.  Pt care d/w Dr. Jeraldine Loots  Final Clinical  Impressions(s) / ED Diagnoses   Final diagnoses:  Acute drug withdrawal syndrome without complication [facility])  Accelerated hypertension  Tachycardia    New Prescriptions Current Discharge Medication List       Hebert Soho, MD 01/04/17 1144    Gerhard Munch, MD 01/07/17 0003

## 2017-01-02 NOTE — ED Notes (Signed)
Repeat ekg requested by dr Jeraldine Lootslockwood

## 2017-01-02 NOTE — ED Notes (Signed)
Attempted to stick patient to obtain blood cultures x2 unsuccessfully

## 2017-01-02 NOTE — H&P (Signed)
History and Physical    Brian Grimes ZOX:[id number] DOB: 1951/07/07 DOA: 01/02/2017  PCP: System, Pcp Not In  Patient coming from: Home  I have personally briefly reviewed patient's old medical records in [facility]  Chief Complaint: N/V  HPI: Brian Grimes is a 66 y.o. male with medical history significant of CABG done last month [facility].  Saw his CVTS x4 days ago in office with generalized weakness, lightheadedness.  Found to have hypotension with BP 87/45, HR 75 at that time.  Adjustments made to BP meds.  Became ill yesterday with NV.  No BP meds at all last night or this AM.  Presents to ED.  Symptoms severe, persistent, nothing makes better or worse.  No CP, no SOB.    ED Course: Initial lactate 4.3, down to 2.1 after 2L LR.  BGL 446.  BP running 200s/140s with HR 140s.  Got 5 of metoprolol with only short effect.  Single episode of coffee ground appearing emesis now in ED, small amount.  Got empirically started on PPI GTT, HGB 12.2.   Review of Systems: As per HPI otherwise 10 point review of systems negative.   Past Medical History:  Diagnosis Date  . BPH (benign prostatic hyperplasia)   . Diabetes mellitus without complication (HCC)   . Hypertension     Past Surgical History:  Procedure Laterality Date  . CORONARY ARTERY BYPASS GRAFT  11/25/2016     reports that he has never smoked. He has never used smokeless tobacco. He reports that he does not drink alcohol. His drug history is not on file.  Allergies  Allergen Reactions  . Lipitor [Atorvastatin] Other (See Comments)    Muscle aches (legs)  . Pollen Extract Other (See Comments)    Runny nose, runny eyes, sneezing, etc..  . Pravastatin Other (See Comments)    Muscle aches (legs)    Family History  Problem Relation Age of Onset  . Diabetes Mother   . Kidney disease Mother   . Hypertension Mother   . Stroke Father   . Kidney disease Brother      Prior to Admission medications     Medication Sig Start Date End Date Taking? Authorizing Provider  clopidogrel (PLAVIX) 75 MG tablet Take 75 mg by mouth daily.   Yes [provider]  fluticasone (FLONASE ALLERGY RELIEF) 50 MCG/ACT nasal spray Place 1 spray into both nostrils daily as needed for allergies or rhinitis.   Yes [provider]  glipiZIDE (GLUCOTROL) 5 MG tablet Take 2.5 mg by mouth daily before breakfast.   Yes [provider]  hydrALAZINE (APRESOLINE) 50 MG tablet Take 50 mg by mouth every 8 (eight) hours.   Yes [provider]  lisinopril (PRINIVIL,ZESTRIL) 5 MG tablet Take 5 mg by mouth daily.   Yes [provider]  loratadine (CLARITIN) 10 MG tablet Take 10 mg by mouth daily as needed for allergies or rhinitis.   Yes [provider]  metFORMIN (GLUCOPHAGE) 1000 MG tablet Take 1,000 mg by mouth 2 (two) times daily with a meal.   Yes [provider]  metoprolol tartrate (LOPRESSOR) 100 MG tablet Take 100 mg by mouth 2 (two) times daily.   Yes [provider]  sennosides-docusate sodium (SENOKOT-S) 8.6-50 MG tablet Take 2 tablets by mouth at bedtime as needed for constipation.   Yes [provider]    Physical Exam: Vitals:   01/02/17 1915 01/02/17 1945 01/02/17 1952 01/02/17 1955  BP: Marland Kitchen)  195/134 (!) 198/137 (!) 199/125 (!) 179/122  Pulse: (!) 139 (!) 139 (!) 137 (!) 123  Resp: (!) 22 (!) 31 18 (!) 25  Temp:      TempSrc:      SpO2: 98% 97% 100% 99%    Constitutional: NAD, calm, comfortable Eyes: PERRL, lids and conjunctivae normal ENMT: Mucous membranes are moist. Posterior pharynx clear of any exudate or lesions.Normal dentition.  Neck: normal, supple, no masses, no thyromegaly Respiratory: clear to auscultation bilaterally, no wheezing, no crackles. Normal respiratory effort. No accessory muscle use.  Cardiovascular: Regular rate and rhythm, no murmurs / rubs / gallops. No extremity edema. 2+ pedal pulses. No carotid  bruits.  Abdomen: no tenderness, no masses palpated. No hepatosplenomegaly. Bowel sounds positive.  Musculoskeletal: no clubbing / cyanosis. No joint deformity upper and lower extremities. Good ROM, no contractures. Normal muscle tone.  Skin: no rashes, lesions, ulcers. No induration, Healing sternotomy incision, no erythema, discharge or other signs of infection. Neurologic: CN 2-12 grossly intact. Sensation intact, DTR normal. Strength 5/5 in all 4.  Psychiatric: Normal judgment and insight. Alert and oriented x 3. Normal mood.    Labs on Admission: I have personally reviewed following labs and imaging studies  CBC:  Recent Labs Lab 01/02/17 1428  WBC 9.6  HGB 12.2*  HCT 36.3*  MCV 85.6  PLT 428*   Basic Metabolic Panel:  Recent Labs Lab 01/02/17 1428  NA 140  K 3.6  CL 98*  CO2 19*  GLUCOSE 446*  BUN 16  CREATININE 1.89*  CALCIUM 10.0   GFR: CrCl cannot be calculated (Unknown ideal weight.). Liver Function Tests:  Recent Labs Lab 01/02/17 1428  AST 27  ALT 14*  ALKPHOS 81  BILITOT 1.1  PROT 8.9*  ALBUMIN 4.2   No results for input(s): LIPASE, AMYLASE in the last 168 hours. No results for input(s): AMMONIA in the last 168 hours. Coagulation Profile: No results for input(s): INR, PROTIME in the last 168 hours. Cardiac Enzymes: No results for input(s): CKTOTAL, CKMB, CKMBINDEX, TROPONINI in the last 168 hours. BNP (last 3 results) No results for input(s): PROBNP in the last 8760 hours. HbA1C: No results for input(s): HGBA1C in the last 72 hours. CBG: No results for input(s): GLUCAP in the last 168 hours. Lipid Profile: No results for input(s): CHOL, HDL, LDLCALC, TRIG, CHOLHDL, LDLDIRECT in the last 72 hours. Thyroid Function Tests: No results for input(s): TSH, T4TOTAL, FREET4, T3FREE, THYROIDAB in the last 72 hours. Anemia Panel: No results for input(s): VITAMINB12, FOLATE, FERRITIN, TIBC, IRON, RETICCTPCT in the last 72 hours. Urine analysis:     Component Value Date/Time   COLORURINE STRAW (A) 01/02/2017 1853   APPEARANCEUR CLEAR 01/02/2017 1853   LABSPEC 1.016 01/02/2017 1853   PHURINE 5.0 01/02/2017 1853   GLUCOSEU >=500 (A) 01/02/2017 1853   HGBUR MODERATE (A) 01/02/2017 1853   BILIRUBINUR NEGATIVE 01/02/2017 1853   KETONESUR 20 (A) 01/02/2017 1853   PROTEINUR >=300 (A) 01/02/2017 1853   NITRITE NEGATIVE 01/02/2017 1853   LEUKOCYTESUR NEGATIVE 01/02/2017 1853    Radiological Exams on Admission: Dg Chest Portable 1 View  Result Date: 01/02/2017 CLINICAL DATA:  Two days of nausea and vomiting. Tachycardia. History of diabetes and coronary artery disease. CABG in May 2018. EXAM: PORTABLE CHEST 1 VIEW COMPARISON:  None in PACs FINDINGS: The left lung is well-expanded and clear. On the right there is mild volume loss. Linear increased density in the right perihilar and infrahilar region is present. There is  blunting of the right lateral costophrenic angle. The heart and pulmonary vascularity are normal. There is calcification in the wall of the aortic arch. The sternal wires are intact. IMPRESSION: Right perihilar and basilar subsegmental atelectasis. Small right pleural effusion versus pleural thickening. No pulmonary edema. Previous CABG. Thoracic aortic atherosclerosis. Electronically Signed   By: David  SwazilandJordan M.D.   On: 01/02/2017 16:05    EKG: Independently reviewed.  Assessment/Plan Principal Problem:   Hypertensive urgency Active Problems:   CKD stage 3 due to type 2 diabetes mellitus (HCC)   Nausea vomiting and diarrhea   Lactic acidosis   Hyperglycemia due to type 2 diabetes mellitus (HCC)   CAD (coronary artery disease), native coronary artery    1. HTN urgency - with tachycardia 1. Likely secondary to adjustments of BP meds within the past couple of days, possibly rebound hypertension due to stopping clonidine (had been on 0.3mg  Q8H prior to the 21st). 2. PRN labetalol 1. 10mg  had some effect, trying another  10 now 2. If this fails then may need GTT 3. Resume home BP meds 4. Tele monitor 2. Hyperglycemia with DM2 - 1. SSI Q4H 2. Repeat BMP in AM 3. Lactic acidosis - 1. Possibly secondary to #1 above 2. Cleared with IVF 3. Holding metformin 4. BCx pending, but infection felt less likely as primary cause given profound hypertension 4. N/V - 1. Zofran prn 2. Sending for gastrocult 3. If negative then stop PPI gtt and resume plavix (held for moment) 4. If positive then continue this, possibly call GI in AM, suspect mallory-weis though 5. CAD - 1. Holding plavix due to possible UGIB 2. No recent stents, just had CABG last month 6. CKD stage 3 - slight elevation from baseline likely due to dehydration secondary to N/V, looks like he typically runs creat of 1.5 or so.  DVT prophylaxis: SCDs - possible UGIB with hematemesis Code Status: Full Family Communication: Family at bedside Disposition Plan: Home after admit Consults called: None Admission status: Admit to inpatient   Hillary BowGARDNER, JARED M. DO [facility] Pager [phone]  If 7AM-7PM, please contact day team taking care of patient [email or web address] Password [email or web address]  01/02/2017, 7:59 PM

## 2017-01-02 NOTE — ED Notes (Signed)
Dr. Silverio LayYao made aware of patient's HR in the 150s

## 2017-01-03 ENCOUNTER — Inpatient Hospital Stay (HOSPITAL_COMMUNITY): Payer: Medicare Other

## 2017-01-03 LAB — GASTRIC OCCULT BLOOD (1-CARD TO LAB): Occult Blood, Gastric: POSITIVE — AB

## 2017-01-03 LAB — GLUCOSE, CAPILLARY
GLUCOSE-CAPILLARY: 221 mg/dL — AB (ref 65–99)
GLUCOSE-CAPILLARY: 329 mg/dL — AB (ref 65–99)
GLUCOSE-CAPILLARY: 329 mg/dL — AB (ref 65–99)
Glucose-Capillary: 259 mg/dL — ABNORMAL HIGH (ref 65–99)
Glucose-Capillary: 219 mg/dL — ABNORMAL HIGH (ref 65–99)
Glucose-Capillary: 250 mg/dL — ABNORMAL HIGH (ref 65–99)

## 2017-01-03 LAB — CBC
HCT: 33.7 % — ABNORMAL LOW (ref 39.0–52.0)
HCT: 34.5 % — ABNORMAL LOW (ref 39.0–52.0)
HEMOGLOBIN: 10.9 g/dL — AB (ref 13.0–17.0)
HEMOGLOBIN: 11.4 g/dL — AB (ref 13.0–17.0)
MCH: 28.2 pg (ref 26.0–34.0)
MCH: 28.4 pg (ref 26.0–34.0)
MCHC: 32.3 g/dL (ref 30.0–36.0)
MCHC: 33 g/dL (ref 30.0–36.0)
MCV: 86 fL (ref 78.0–100.0)
MCV: 87.1 fL (ref 78.0–100.0)
PLATELETS: 359 10*3/uL (ref 150–400)
PLATELETS: 359 10*3/uL (ref 150–400)
RBC: 3.87 MIL/uL — AB (ref 4.22–5.81)
RBC: 4.01 MIL/uL — AB (ref 4.22–5.81)
RDW: 15.3 % (ref 11.5–15.5)
RDW: 15.7 % — ABNORMAL HIGH (ref 11.5–15.5)
WBC: 10.6 10*3/uL — AB (ref 4.0–10.5)
WBC: 10.2 10*3/uL (ref 4.0–10.5)

## 2017-01-03 LAB — BASIC METABOLIC PANEL
ANION GAP: 10 (ref 5–15)
BUN: 16 mg/dL (ref 6–20)
CHLORIDE: 105 mmol/L (ref 101–111)
CO2: 26 mmol/L (ref 22–32)
Calcium: 8.9 mg/dL (ref 8.9–10.3)
Creatinine, Ser: 1.76 mg/dL — ABNORMAL HIGH (ref 0.61–1.24)
GFR, EST AFRICAN AMERICAN: 44 mL/min — AB (ref >60)
GFR, EST NON AFRICAN AMERICAN: 38 mL/min — AB (ref >60)
Glucose, Bld: 277 mg/dL — ABNORMAL HIGH (ref 65–99)
POTASSIUM: 3.3 mmol/L — AB (ref 3.5–5.1)
SODIUM: 141 mmol/L (ref 135–145)

## 2017-01-03 LAB — LACTIC ACID, PLASMA: LACTIC ACID, VENOUS: 1.6 mmol/L (ref 0.5–1.9)

## 2017-01-03 LAB — TROPONIN I
Troponin I: 0.08 ng/mL (ref <0.03)
Troponin I: 0.08 ng/mL (ref <0.03)
Troponin I: 0.07 ng/mL (ref <0.03)

## 2017-01-03 LAB — POCT GASTRIC OCCULT BLOOD (1-CARD TO LAB)

## 2017-01-03 LAB — MAGNESIUM
MAGNESIUM: 1.3 mg/dL — AB (ref 1.7–2.4)
Magnesium: 1.5 mg/dL — ABNORMAL LOW (ref 1.7–2.4)

## 2017-01-03 LAB — PROCALCITONIN

## 2017-01-03 LAB — MRSA PCR SCREENING: MRSA by PCR: NEGATIVE

## 2017-01-03 LAB — PHOSPHORUS: PHOSPHORUS: 3.3 mg/dL (ref 2.5–4.6)

## 2017-01-03 MED ORDER — PANTOPRAZOLE SODIUM 40 MG IV SOLR
40.0000 mg | Freq: Two times a day (BID) | INTRAVENOUS | Status: DC
  Administered 2017-01-03 – 2017-01-08 (×11): 40 mg via INTRAVENOUS
  Filled 2017-01-03 (×11): qty 40

## 2017-01-03 MED ORDER — MAGNESIUM SULFATE 4 GM/100ML IV SOLN
4.0000 g | Freq: Once | INTRAVENOUS | Status: DC
  Filled 2017-01-03: qty 100

## 2017-01-03 MED ORDER — INSULIN ASPART 100 UNIT/ML ~~LOC~~ SOLN
0.0000 [IU] | Freq: Every day | SUBCUTANEOUS | Status: DC
  Administered 2017-01-03: 4 [IU] via SUBCUTANEOUS
  Administered 2017-01-05: 3 [IU] via SUBCUTANEOUS
  Administered 2017-01-07: 4 [IU] via SUBCUTANEOUS

## 2017-01-03 MED ORDER — MAGNESIUM SULFATE 2 GM/50ML IV SOLN
2.0000 g | Freq: Once | INTRAVENOUS | Status: AC
  Administered 2017-01-03: 2 g via INTRAVENOUS

## 2017-01-03 MED ORDER — METOPROLOL TARTRATE 50 MG PO TABS
100.0000 mg | ORAL_TABLET | Freq: Two times a day (BID) | ORAL | Status: DC
  Administered 2017-01-03 – 2017-01-04 (×2): 100 mg via ORAL
  Filled 2017-01-03: qty 2
  Filled 2017-01-03 (×2): qty 1

## 2017-01-03 MED ORDER — INSULIN ASPART 100 UNIT/ML ~~LOC~~ SOLN
0.0000 [IU] | Freq: Three times a day (TID) | SUBCUTANEOUS | Status: DC
  Administered 2017-01-03 (×2): 5 [IU] via SUBCUTANEOUS
  Administered 2017-01-04: 3 [IU] via SUBCUTANEOUS
  Administered 2017-01-04: 2 [IU] via SUBCUTANEOUS
  Administered 2017-01-04 – 2017-01-05 (×2): 8 [IU] via SUBCUTANEOUS
  Administered 2017-01-05: 5 [IU] via SUBCUTANEOUS
  Administered 2017-01-05: 3 [IU] via SUBCUTANEOUS
  Administered 2017-01-06: 15 [IU] via SUBCUTANEOUS
  Administered 2017-01-06: 8 [IU] via SUBCUTANEOUS
  Administered 2017-01-07: 11 [IU] via SUBCUTANEOUS
  Administered 2017-01-08: 3 [IU] via SUBCUTANEOUS
  Administered 2017-01-08 (×2): 11 [IU] via SUBCUTANEOUS
  Administered 2017-01-09: 5 [IU] via SUBCUTANEOUS
  Administered 2017-01-09: 2 [IU] via SUBCUTANEOUS

## 2017-01-03 MED ORDER — HYDRALAZINE HCL 50 MG PO TABS
100.0000 mg | ORAL_TABLET | Freq: Three times a day (TID) | ORAL | Status: DC
  Administered 2017-01-03 – 2017-01-04 (×3): 100 mg via ORAL
  Filled 2017-01-03 (×5): qty 2

## 2017-01-03 MED ORDER — MAGNESIUM SULFATE 2 GM/50ML IV SOLN
2.0000 g | Freq: Once | INTRAVENOUS | Status: DC
  Filled 2017-01-03: qty 50

## 2017-01-03 MED ORDER — HYDRALAZINE HCL 20 MG/ML IJ SOLN
20.0000 mg | INTRAMUSCULAR | Status: DC | PRN
  Administered 2017-01-03 – 2017-01-04 (×2): 20 mg via INTRAVENOUS
  Filled 2017-01-03 (×2): qty 1

## 2017-01-03 MED ORDER — POTASSIUM CHLORIDE CRYS ER 20 MEQ PO TBCR
40.0000 meq | EXTENDED_RELEASE_TABLET | Freq: Once | ORAL | Status: AC
  Administered 2017-01-03: 40 meq via ORAL
  Filled 2017-01-03: qty 2

## 2017-01-03 NOTE — Progress Notes (Signed)
PULMONARY / CRITICAL CARE MEDICINE   Name: Brian Grimes MRN: [id number] DOB: 09-28-1950    ADMISSION DATE:  01/02/2017 CONSULTATION DATE:  01/02/2017  REFERRING MD:  Dr. 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 Upon arrival to ED patient HR 146, BP 201/134, WBC 9.6, Lactic Acid 4.3, Glucose 446, Anion Gap 23, +Ketones. In ED had one reported episode of coffee ground emesis. Patient reports that he went to CT Surgery Follow up at [facility] on 6/21 where his BP was 82/47, he was instructed to stop clonidine and check BP before taking metoprolol. However, for the last 2 days patient has been unable to take BP medications due to nausea. In ED given 2L LR and 5 mg metoprolol, 20 mg Labetolol, and 0.2 mg Clonidine, and initially admitted to [facility], however BP still uncontrolled so placed on Labetalol gtt. PCCM to transfer to ICU.    SUBJECTIVE:  Denies pain   VITAL SIGNS: BP (!) 158/96   Pulse 98   Temp 98.4 F (36.9 C) (Oral)   Resp 16   Ht 6\' 2"  (1.88 m)   Wt 192 lb 3.9 oz (87.2 kg)   SpO2 99%   BMI 24.68 kg/m  Room air  HEMODYNAMICS:    VENTILATOR SETTINGS:    INTAKE / OUTPUT: I/O last 3 completed shifts: In: 3032.6 [I.V.:2982.6; IV Piggyback:50] Out: 750 [Urine:750]  Physical Exam  Constitutional: He is oriented to person, place, and time. He appears well-developed. No distress.  HENT:  Head: Normocephalic and atraumatic.  Eyes: Conjunctivae are normal. Pupils are equal, round, and reactive to light.  Neck: Normal range of motion. Neck supple. No JVD present.  Cardiovascular: Normal rate, regular rhythm and normal heart sounds.   Pulmonary/Chest: Effort normal and breath sounds normal. No respiratory distress. He has no wheezes. He has no rales.   Abdominal: Soft. Bowel sounds are normal. He exhibits no distension.  Musculoskeletal: Normal range of motion. He exhibits no edema.  Neurological: He is alert and oriented to person, place, and time.  Skin: Skin is warm and dry. Capillary refill takes less than 2 seconds. He is not diaphoretic. No erythema.  Psychiatric: He has a normal mood and affect.   LABS:  BMET  Recent Labs Lab 01/02/17 1428 01/03/17 0419  NA 140 141  K 3.6 3.3*  CL 98* 105  CO2 19* 26  BUN 16 16  CREATININE 1.89* 1.76*  GLUCOSE 446* 277*    Electrolytes  Recent Labs Lab 01/02/17 1428 01/03/17 0032 01/03/17 0419  CALCIUM 10.0  --  8.9  MG  --  1.3* 1.5*  PHOS  --   --  3.3    CBC  Recent Labs Lab 01/02/17 1428 01/03/17 0032 01/03/17 0419  WBC 9.6 10.2 10.6*  HGB 12.2* 11.4* 10.9*  HCT 36.3* 34.5* 33.7*  PLT 428* 359 359    Coag's No results for input(s): APTT, INR in the last 168 hours.  Sepsis Markers  Recent Labs Lab 01/02/17 1427 01/02/17 1838 01/03/17 0032 01/03/17 0419  LATICACIDVEN 4.30* 2.12* 1.6  --   PROCALCITON  --   --  <0.10 <0.10    ABG No results for input(s): PHART, PCO2ART, PO2ART in the last 168 hours.  Liver Enzymes  Recent Labs Lab 01/02/17 1428  AST 27  ALT 14*  ALKPHOS 81  BILITOT 1.1  ALBUMIN 4.2    Cardiac Enzymes  Recent Labs Lab 01/03/17 0032 01/03/17 0419  TROPONINI 0.08* 0.08*    Glucose  Recent Labs Lab 01/02/17 2012 01/03/17 0007 01/03/17 0303  GLUCAP 294* 329* 259*    Imaging Dg Chest Portable 1 View  Result Date: 01/02/2017 CLINICAL DATA:  Two days of nausea and vomiting. Tachycardia. History of diabetes and coronary artery disease. CABG in May 2018. EXAM: PORTABLE CHEST 1 VIEW COMPARISON:  None in PACs FINDINGS: The left lung is well-expanded and clear. On the right there is mild volume loss. Linear increased density in the right perihilar and infrahilar region is present. There is blunting of the right lateral  costophrenic angle. The heart and pulmonary vascularity are normal. There is calcification in the wall of the aortic arch. The sternal wires are intact. IMPRESSION: Right perihilar and basilar subsegmental atelectasis. Small right pleural effusion versus pleural thickening. No pulmonary edema. Previous CABG. Thoracic aortic atherosclerosis. Electronically Signed   By: David  Swazil[place]Jordan M.D.   On: 01/02/2017 16:05    STUDIES:  EKG 6/25 >> ST 150, prolonged QTc 530 PCXR 6/25 >> right perihilar and basilar subsegmental atelectasis; small right pleural effusion vs pleural thickening; previous CABG; thoracic aortic atherosclerosis   [facility]  ANTIBIOTICS: n/a  SIGNIFICANT EVENTS: 6/25  Admit   LINES/TUBES: PIV   DISCUSSION: [age] yoM with recent CABG in May presented with N/V x 2 days and HTN urgency.    ASSESSMENT / PLAN:   HTN urgency & tachycardia from no home HTN meds for 2 days (rebound HTN from abruptly stopping clonidine) QTc prolongation [facility] Minimal trop elevation  Plan Cont Clonidine 0.2mg  tid Increase apresoline to 50mg  tid  Resume lopressor 100mg  bid Resume plavix and asa  AKI (baseline sCr 1.5)-->improved after IVFs AGMA/ Lactic acidosis - ddx to N/V, metformin, ?hypovolemia 2/2 decreased oral intake -->resolved CKD stage III Hypokalemia and hypomagnesemia  Plan:   kvo ivfs Avoid hypotensives and nephrotoxins Repeat chem in am     Right atelectasis Plan Mobilize   R/o UGIB 2/2 small amount of coffee ground emesis  - suspect mild gastritis  - normal LFTs - no further vomiting  Plan  Change protonix to bid 1 view abd Cl liq diet adv as tol   DM - w/ AG/ +ketones -ag closed  plan:   SSI sensitive CBG q 4 Hold Metformin   Anemia- mild  P:  Trend cbc scds    FAMILY  - Updates: Family updated at bedside   - Inter-disciplinary family meet or Palliative Care meeting due by:  01/09/2017  Simonne MartinetPeter E Babcock  ACNP-BC [facility] Pager # [phone] OR # [phone] if no answer   STAFF NOTE: I, Rory Percyaniel Varnell Donate, MD FACP have personally reviewed patient's available data, including medical history, events of note, physical examination and test results as part of my evaluation. I have discussed with resident/NP and other care providers such as pharmacist, RN and RRT. In addition, I personally evaluated patient and elicited key findings of: *awake, flat affect, nonfocal exam, perrl, cn intact, lungs clear, jvd wnl, abdo soft, wound cabg wnl,  No edema, presents after clonidine held with rebound  hTN crisis, he he is now titrated off labetolol drip with addition clon and now will add BB, likley will need clon increase in am , goals  remain st 25% reduction MAp over days to normal BP, follow crt and if remains higher then baseline will need more volume maintenance, maintain hydral, he had n / x 1-2 now resolved with normal neuro exam, obtain kub and if reoccurs may need CT head after htn crisis, advannce diet, trop neg , no evidence ischemia, trop neg, mag, k supp, I updated pt and wife in room  Mcarthur Rossetti. Tyson Alias, MD, FACP Pgr: [phone] [place] [facility] 01/03/2017 11:35 AM

## 2017-01-03 NOTE — Care Management Note (Signed)
Case Management Note  Patient Details  Name: Brian LeatherwoodMelvin L Barrientez MRN: [id number] Date of Birth: 12/21/1950  Subjective/Objective:   Pt admitted with rebound hypertension from stopping BP meds per CVTS outpt office visit                 Action/Plan:   PTA from home with wife.  [facility]     Expected Discharge Date:                  Expected Discharge Plan:  Home/Self Care  In-House Referral:     Discharge planning Services  CM Consult  Post Acute Care Choice:    Choice offered to:     DME Arranged:    DME Agency:     HH Arranged:    HH Agency:     Status of Service:     If discussed at [employer] Length of Stay Meetings, dates discussed:    Additional Comments:  Cherylann ParrClaxton, Starlena Beil S, RN 01/03/2017, 4:03 PM

## 2017-01-04 LAB — COMPREHENSIVE METABOLIC PANEL
ALT: 10 U/L — ABNORMAL LOW (ref 17–63)
ANION GAP: 8 (ref 5–15)
AST: 16 U/L (ref 15–41)
Albumin: 3 g/dL — ABNORMAL LOW (ref 3.5–5.0)
Alkaline Phosphatase: 57 U/L (ref 38–126)
BILIRUBIN TOTAL: 0.9 mg/dL (ref 0.3–1.2)
BUN: 18 mg/dL (ref 6–20)
CHLORIDE: 107 mmol/L (ref 101–111)
CO2: 26 mmol/L (ref 22–32)
Calcium: 8.8 mg/dL — ABNORMAL LOW (ref 8.9–10.3)
Creatinine, Ser: 2.13 mg/dL — ABNORMAL HIGH (ref 0.61–1.24)
GFR, EST AFRICAN AMERICAN: 35 mL/min — AB (ref >60)
GFR, EST NON AFRICAN AMERICAN: 30 mL/min — AB (ref >60)
Glucose, Bld: 154 mg/dL — ABNORMAL HIGH (ref 65–99)
POTASSIUM: 3.5 mmol/L (ref 3.5–5.1)
Sodium: 141 mmol/L (ref 135–145)
TOTAL PROTEIN: 5.8 g/dL — AB (ref 6.5–8.1)

## 2017-01-04 LAB — CBC
HEMATOCRIT: 30.6 % — AB (ref 39.0–52.0)
Hemoglobin: 9.7 g/dL — ABNORMAL LOW (ref 13.0–17.0)
MCH: 28 pg (ref 26.0–34.0)
MCHC: 31.7 g/dL (ref 30.0–36.0)
MCV: 88.2 fL (ref 78.0–100.0)
PLATELETS: 303 10*3/uL (ref 150–400)
RBC: 3.47 MIL/uL — ABNORMAL LOW (ref 4.22–5.81)
RDW: 16.1 % — AB (ref 11.5–15.5)
WBC: 11.5 10*3/uL — AB (ref 4.0–10.5)

## 2017-01-04 LAB — URINE CULTURE: CULTURE: NO GROWTH

## 2017-01-04 LAB — GLUCOSE, CAPILLARY
Glucose-Capillary: 190 mg/dL — ABNORMAL HIGH (ref 65–99)
Glucose-Capillary: 131 mg/dL — ABNORMAL HIGH (ref 65–99)
Glucose-Capillary: 259 mg/dL — ABNORMAL HIGH (ref 65–99)
Glucose-Capillary: 191 mg/dL — ABNORMAL HIGH (ref 65–99)
Glucose-Capillary: 177 mg/dL — ABNORMAL HIGH (ref 65–99)

## 2017-01-04 LAB — TROPONIN I: TROPONIN I: 0.05 ng/mL — AB (ref <0.03)

## 2017-01-04 MED ORDER — BOOST / RESOURCE BREEZE PO LIQD
1.0000 | Freq: Two times a day (BID) | ORAL | Status: DC
  Administered 2017-01-05 – 2017-01-08 (×4): 1 via ORAL

## 2017-01-04 MED ORDER — METOPROLOL TARTRATE 5 MG/5ML IV SOLN
10.0000 mg | Freq: Four times a day (QID) | INTRAVENOUS | Status: DC | PRN
  Administered 2017-01-04 (×2): 10 mg via INTRAVENOUS
  Filled 2017-01-04 (×2): qty 10

## 2017-01-04 MED ORDER — HYDRALAZINE HCL 20 MG/ML IJ SOLN
20.0000 mg | Freq: Four times a day (QID) | INTRAMUSCULAR | Status: DC
  Administered 2017-01-04 – 2017-01-05 (×2): 20 mg via INTRAVENOUS
  Filled 2017-01-04 (×2): qty 1

## 2017-01-04 MED ORDER — CLONIDINE HCL 0.2 MG/24HR TD PTWK
0.2000 mg | MEDICATED_PATCH | TRANSDERMAL | Status: DC
  Administered 2017-01-04: 0.2 mg via TRANSDERMAL
  Filled 2017-01-04: qty 1

## 2017-01-04 MED ORDER — METOPROLOL TARTRATE 5 MG/5ML IV SOLN
5.0000 mg | Freq: Four times a day (QID) | INTRAVENOUS | Status: DC
  Administered 2017-01-04: 5 mg via INTRAVENOUS
  Filled 2017-01-04: qty 5

## 2017-01-04 MED ORDER — HYDRALAZINE HCL 20 MG/ML IJ SOLN: 10.0000 mg | Freq: Four times a day (QID) | INTRAMUSCULAR | Status: DC

## 2017-01-04 MED ORDER — CLONIDINE HCL 0.2 MG PO TABS: 0.3000 mg | ORAL_TABLET | Freq: Three times a day (TID) | ORAL | Status: DC

## 2017-01-04 MED ORDER — METOPROLOL TARTRATE 5 MG/5ML IV SOLN
10.0000 mg | Freq: Four times a day (QID) | INTRAVENOUS | Status: DC
  Administered 2017-01-04: 10 mg via INTRAVENOUS
  Filled 2017-01-04: qty 10

## 2017-01-04 MED ORDER — ISOSORBIDE DINITRATE 10 MG PO TABS: 5.0000 mg | ORAL_TABLET | Freq: Three times a day (TID) | ORAL | Status: DC

## 2017-01-04 MED ORDER — SODIUM CHLORIDE 0.45 % IV SOLN
INTRAVENOUS | Status: DC
  Administered 2017-01-04 – 2017-01-05 (×3): via INTRAVENOUS

## 2017-01-04 MED ORDER — LABETALOL HCL 5 MG/ML IV SOLN
20.0000 mg | Freq: Once | INTRAVENOUS | Status: AC
  Administered 2017-01-04: 20 mg via INTRAVENOUS
  Filled 2017-01-04: qty 4

## 2017-01-04 MED ORDER — PROMETHAZINE HCL 25 MG/ML IJ SOLN
12.5000 mg | Freq: Four times a day (QID) | INTRAMUSCULAR | Status: DC | PRN
  Administered 2017-01-04 – 2017-01-05 (×3): 12.5 mg via INTRAVENOUS
  Filled 2017-01-04 (×3): qty 1

## 2017-01-04 MED ORDER — HYDRALAZINE HCL 20 MG/ML IJ SOLN: 20.0000 mg | Freq: Four times a day (QID) | INTRAMUSCULAR | Status: DC | PRN

## 2017-01-04 MED ORDER — METOPROLOL TARTRATE 5 MG/5ML IV SOLN: 10.0000 mg | Freq: Four times a day (QID) | INTRAVENOUS | Status: DC

## 2017-01-04 MED ORDER — LABETALOL HCL 5 MG/ML IV SOLN
0.5000 mg/min | INTRAVENOUS | Status: DC
  Filled 2017-01-04: qty 100

## 2017-01-04 MED ORDER — LABETALOL HCL 5 MG/ML IV SOLN: 10.0000 mg | INTRAVENOUS | Status: DC | PRN

## 2017-01-04 MED ORDER — MAGNESIUM SULFATE 2 GM/50ML IV SOLN
2.0000 g | Freq: Once | INTRAVENOUS | Status: AC
  Administered 2017-01-04: 2 g via INTRAVENOUS
  Filled 2017-01-04: qty 50

## 2017-01-04 NOTE — Progress Notes (Signed)
Initial Nutrition Assessment  DOCUMENTATION CODES:      INTERVENTION:   -Boost Breeze po BID, each supplement provides 250 kcal and 9 grams of protein -Once diet is advanced, add Glucerna Shake po BID, each supplement provides 220 kcal and 10 grams of protein  NUTRITION DIAGNOSIS:   Inadequate oral intake related to nausea, vomiting as evidenced by per patient/family report.  GOAL:   Patient will meet greater than or equal to 90% of their needs  MONITOR:   PO intake, Supplement acceptance, Diet advancement, Labs, Weight trends, Skin, I & O's  REASON FOR ASSESSMENT:   Consult Enteral/tube feeding initiation and management  ASSESSMENT:   [age] yoM with recent CABG in May presented with N/V x 2 days and HTN urgency.    RD consulted for enteral nutrition initiation and management. Noted pt is not vent dependent nor does he have feeding access (and with no plans to do so). Suspect consult was entered in error.   Spoke with pt, who reports that he has had a good appetite prior to 3 days ago, secondary to nausea and vomiting ("I couldn't keep anything down"). Typical meal intake is 3 meals per day (breakfast of grits, eggs, and toasst, lunch of sandwich, dinner of meat, starch, and vegetable). Pt was also consuming 1 Glucerna supplement daily. Pt is currently on a clear liquid diet; pt tolerating well and reports that he feels ready to eat more substantial foods.   Pt endorses weight loss at least over the past month, which he attributes to previous hospitalizations- shares UBW of 215#, however, no wt documentation to support this claim. Per Care Everywhere Records, pt was 196# on encounter on 12/29/16. Wt encounters variable during hospitalization (between 185-192#).   Nutrition-Focused physical exam completed. Findings are no fat depletion, mild muscle depletion, and no edema.   Discussed importance of good meal and supplement intake to promote healing. Pt amenable to Boost Breeze  supplements while on clear liquid diet and resuming Glucerna supplements.   Labs reviewed: CBGS: 131-329.  Diet Order:  Diet clear liquid Room service appropriate? Yes; Fluid consistency: Thin  Skin:   (closed medial chest incision)  Last BM:  01/01/17  Height:   Ht Readings from Last 1 Encounters:  01/03/17 6\' 2"  (1.88 m)    Weight:   Wt Readings from Last 1 Encounters:  01/04/17 185 lb 11.2 oz (84.2 kg)    Ideal Body Weight:  86.4 kg  BMI:  Body mass index is 23.84 kg/m.  Estimated Nutritional Needs:   Kcal:  1900-2100  Protein:  90-105 grams  Fluid:  1.9-2.1 L  EDUCATION NEEDS:   Education needs addressed  Kelcy Baeten A. Mayford KnifeWilliams, RD, LDN, CDE Pager: [phone] After hours Pager: [phone]

## 2017-01-04 NOTE — Evaluation (Signed)
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   Clinical Impression  Pt functioning near baseline. Pt still adhering to sternal precautions from previous CABG in May. Pt functioning at supervision level and has friend staying with him. Pt with mild instability with ambulation, anticipate this to improve quickly. Acute PT to con't to follow to progress to indep.    Follow Up Recommendations No PT follow up;Supervision/Assistance - 24 hour    Equipment Recommendations  None recommended by PT    Recommendations for Other Services       Precautions / Restrictions Precautions Precautions: Sternal;Fall (from recent CABG) Precaution Comments: pt able to recall and demo'd carryover with function Restrictions Weight Bearing Restrictions: No      Mobility  Bed Mobility Overal bed mobility: Modified Independent             General bed mobility comments: pt did not push/pull, HOB was flat, no physical assist needed  Transfers Overall transfer level: Needs assistance Equipment used: None Transfers: Sit to/from Stand Sit to Stand: Supervision         General transfer comment: pt demo'd good technique  Ambulation/Gait Ambulation/Gait assistance: Min guard Ambulation Distance (Feet): 200 Feet Assistive device: None Gait Pattern/deviations: Step-through pattern;Decreased stride length;Staggering right;Staggering left Gait velocity: slow Gait velocity interpretation: Below normal speed for age/gender General Gait Details: pt mildly unsteady however first time up, improved with time, no overt episodes of LOB  Stairs Stairs: Yes Stairs assistance: Min guard Stair Management: One rail Left Number of Stairs:  2 General stair comments: no episodes of LOB  Wheelchair Mobility    Modified Rankin (Stroke Patients Only)       Balance Overall balance assessment: No apparent balance deficits (not formally assessed)                                           Pertinent Vitals/Pain Pain Assessment: No/denies pain    Home Living Family/patient expects to be discharged to:: Private residence Living Arrangements: Alone Available Help at Discharge: Friend(s);Available 24 hours/day (has a friend staying with him) Type of Home: House Home Access: Stairs to enter Entrance Stairs-Rails: Left Entrance Stairs-Number of Steps: 1 Home Layout: One level Home Equipment: None      Prior Function Level of Independence: Needs assistance   Gait / Transfers Assistance Needed: amb without AD  ADL's / Homemaking Assistance Needed: not yet cleared to drive, friend doing driving and grocery shopping        Hand Dominance   Dominant Hand: Right    Extremity/Trunk Assessment   Upper Extremity Assessment Upper Extremity Assessment: Overall WFL for tasks assessed    Lower Extremity Assessment Lower Extremity Assessment: Overall WFL for tasks assessed    Cervical / Trunk Assessment Cervical / Trunk Assessment: Normal  Communication   Communication: No difficulties  Cognition Arousal/Alertness: Awake/alert Behavior During Therapy: Flat affect Overall Cognitive Status: Within Functional Limits for tasks assessed  General Comments General comments (skin integrity, edema, etc.): VSS t/o session, BP 130/82 in supine, 142/77 s/p ambulation    Exercises     Assessment/Plan    PT Assessment Patient needs continued PT services  PT Problem List Decreased strength;Decreased activity tolerance;Decreased balance;Decreased mobility       PT Treatment Interventions DME instruction;Gait training;Stair training;Functional mobility  training;Therapeutic activities;Therapeutic exercise;Balance training;Neuromuscular re-education    PT Goals (Current goals can be found in the Care Plan section)  Acute Rehab PT Goals Patient Stated Goal: return home ASAP PT Goal Formulation: With patient Time For Goal Achievement: 01/11/17 Potential to Achieve Goals: Good Additional Goals Additional Goal #1: Pt to score >19 on DGI to inidicate minimal falls risk.    Frequency Min 2X/week   Barriers to discharge Decreased caregiver support has intermittent supervision    Co-evaluation               AM-PAC PT "6 Clicks" Daily Activity  Outcome Measure Difficulty turning over in bed (including adjusting bedclothes, sheets and blankets)?: None Difficulty moving from lying on back to sitting on the side of the bed? : None Difficulty sitting down on and standing up from a chair with arms (e.g., wheelchair, bedside commode, etc,.)?: None Help needed moving to and from a bed to chair (including a wheelchair)?: A Little Help needed walking in hospital room?: A Little Help needed climbing 3-5 steps with a railing? : A Little 6 Click Score: 21    End of Session Equipment Utilized During Treatment: Gait belt Activity Tolerance: Patient tolerated treatment well Patient left: in chair;with call bell/phone within reach Nurse Communication: Mobility status PT Visit Diagnosis: Unsteadiness on feet (R26.81);Muscle weakness (generalized) (M62.81)    Time: 1610-9604 PT Time Calculation (min) (ACUTE ONLY): 20 min   Charges:   PT Evaluation $PT Eval Low Complexity: 1 Procedure     PT G CodesLewis Grimes, PT, DPT Pager #: [phone] Office #: [phone]  Brian Grimes M Brian Grimes 01/04/2017, 10:23 AM

## 2017-01-04 NOTE — Progress Notes (Signed)
[facility] TEAM 1 - Stepdown/ICU TEAM  ROMYN BOSWELL  ZOX:[id number] DOB: 1951/05/14 DOA: 01/02/2017 PCP: System, Pcp Not In    Brief Narrative:  66 y.o. male with history of CABG in May 2018 at [facility] who had a f/u visit 4 days prior to this admit where he c/o generalized weakness and lightheadedness.  He was found to be hypotensive with BP 87/45 and his clonidine (at max dose) was abruptly discontinued.  The day before his admit he developed intractable NV. Upon presentation to the ED he was found to have BP readings in the 200s/140s with HR 140s.  Significant Events: 6/25 admit  6/27 TRH resumed care   Subjective: Today the patient's blood pressure has become severely uncontrolled HTN.  With this he is developed severe nausea with multiple episodes of vomiting.  He is not able to keep down his oral medications or liquids.  He is miserable from his nausea he tells me.  He denies substernal chest pressure or shortness of breath.  Assessment & Plan:  HTN urgency BP has been rebounding/uncontrolled this afternoon, as high as 199/115 - adjust tx further and follow - utilize only IV/transdermal meds as he can keep anything oral down - may require transition to cardene or BB gtt if BP does not improve  Coffee ground emesis / UGIB Clinically seems most consistent with Mallory-Weiss tear or esophagitis/gastritis in setting of recurrent episodes of retching/vomiting - I suspect his nausea is asked to driven by his extreme hypertension - we will utilize Phenergan when necessary and focus on good blood pressure control - presently no indication for acute GI evaluation   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 Denies SSCP at present - no apparent acute complications  Acute kidney injury on CKD Stage 3 Baseline crt 1.5 - suspect injury related to HTN - follow trend w/ ongoing attempts to manage BP  Recent Labs Lab 01/02/17 1428 01/03/17 0419 01/04/17 0246  CREATININE 1.89* 1.76* 2.13*     Hypomagnesemia Replace and follow  DM CBG variable but not at goal - will not adjust tx further today as his intake has been essentially nil - follow trend   DVT prophylaxis: SCDs Code Status: FULL CODE Family Communication: spoke w/ family at bedside   Disposition Plan: SDU  Consultants:  PCCM  Antimicrobials:  none   Objective: Blood pressure (!) 142/77, pulse 94, temperature 98.9 F (37.2 C), temperature source Oral, resp. rate 15, height 6\' 2"  (1.88 m), weight 84.2 kg (185 lb 11.2 oz), SpO2 100 %.  Intake/Output Summary (Last 24 hours) at 01/04/17 1155 Last data filed at 01/04/17 0400  Gross per 24 hour  Intake              195 ml  Output              350 ml  Net             -155 ml   Filed Weights   01/03/17 0005 01/03/17 0350 01/04/17 0500  Weight: 87.2 kg (192 lb 3.9 oz) 87.2 kg (192 lb 3.9 oz) 84.2 kg (185 lb 11.2 oz)    Examination: General: No acute respiratory distress - wretching  Lungs: Clear to auscultation bilaterally without wheezes or crackles Cardiovascular: Tachycardic but regular with no appreciable murmur gallop or rub  Abdomen: Nontender, nondistended, soft, bowel sounds positive, no rebound, no ascites, no appreciable mass Extremities: No significant cyanosis, clubbing, or edema bilateral lower extremities  CBC:  Recent Labs Lab  01/02/17 1428 01/03/17 0032 01/03/17 0419 01/04/17 0246  WBC 9.6 10.2 10.6* 11.5*  HGB 12.2* 11.4* 10.9* 9.7*  HCT 36.3* 34.5* 33.7* 30.6*  MCV 85.6 86.0 87.1 88.2  PLT 428* 359 359 303   Basic Metabolic Panel:  Recent Labs Lab 01/02/17 1428 01/03/17 0032 01/03/17 0419 01/04/17 0246  NA 140  --  141 141  K 3.6  --  3.3* 3.5  CL 98*  --  105 107  CO2 19*  --  26 26  GLUCOSE 446*  --  277* 154*  BUN 16  --  16 18  CREATININE 1.89*  --  1.76* 2.13*  CALCIUM 10.0  --  8.9 8.8*  MG  --  1.3* 1.5*  --   PHOS  --   --  3.3  --    GFR: Estimated Creatinine Clearance: 39.1 mL/min (A) (by C-G formula  based on SCr of 2.13 mg/dL (H)).  Liver Function Tests:  Recent Labs Lab 01/02/17 1428 01/04/17 0246  AST 27 16  ALT 14* 10*  ALKPHOS 81 57  BILITOT 1.1 0.9  PROT 8.9* 5.8*  ALBUMIN 4.2 3.0*    Cardiac Enzymes:  Recent Labs Lab 01/03/17 0032 01/03/17 0419 01/03/17 1009  TROPONINI 0.08* 0.08* 0.07*    CBG:  Recent Labs Lab 01/03/17 1150 01/03/17 1541 01/03/17 1901 01/03/17 2010 01/04/17 0750  GLUCAP 219* 221* 250* 329* 131*    Recent Results (from the past 240 hour(s))  Urine culture     Status: None   Collection Time: 01/02/17  6:53 PM  Result Value Ref Range Status   Specimen Description URINE, RANDOM  Final   Special Requests NONE  Final   Culture NO GROWTH  Final   Report Status 01/04/2017 FINAL  Final  Blood culture (routine x 2)     Status: None (Preliminary result)   Collection Time: 01/02/17  7:45 PM  Result Value Ref Range Status   Specimen Description BLOOD RIGHT HAND  Final   Special Requests   Final    BOTTLES DRAWN AEROBIC AND ANAEROBIC Blood Culture adequate volume   Culture NO GROWTH < 24 HOURS  Final   Report Status PENDING  Incomplete  Blood culture (routine x 2)     Status: None (Preliminary result)   Collection Time: 01/02/17  7:50 PM  Result Value Ref Range Status   Specimen Description BLOOD LEFT HAND  Final   Special Requests   Final    BOTTLES DRAWN AEROBIC AND ANAEROBIC Blood Culture adequate volume   Culture NO GROWTH < 24 HOURS  Final   Report Status PENDING  Incomplete  MRSA PCR Screening     Status: None   Collection Time: 01/03/17 12:13 AM  Result Value Ref Range Status   MRSA by PCR NEGATIVE NEGATIVE Final    Comment:        The GeneXpert MRSA Assay (FDA approved for NASAL specimens only), is one component of a comprehensive MRSA colonization surveillance program. It is not intended to diagnose MRSA infection nor to guide or monitor treatment for MRSA infections.      Scheduled Meds: . cloNIDine  0.2 mg Oral  TID  . feeding supplement  1 Container Oral BID BM  . hydrALAZINE  100 mg Oral Q8H  . insulin aspart  0-15 Units Subcutaneous TID WC  . insulin aspart  0-5 Units Subcutaneous QHS  . metoprolol tartrate  100 mg Oral BID  . pantoprazole (PROTONIX) IV  40 mg Intravenous  Q12H  . sodium chloride flush  3 mL Intravenous Q12H     LOS: 2 days   Lonia Blood, MD [facility] Office  [phone] Pager - Text Page per Amion as per below:  On-Call/Text Page:      [email or web address]      password [email or web address]  If 7PM-7AM, please contact night-coverage [email or web address] Password [facility] 01/04/2017, 11:55 AM

## 2017-01-04 NOTE — Progress Notes (Signed)
S:  Called to bedside to assess patient for persistent hypertension with nausea throughout the day.  Unable to keep down PO HTN meds.  Patient denies any pain.   O:  Blood pressure (!) 156/103, pulse (!) 107, temperature 99.8 F (37.7 C), temperature source Oral, resp. rate 18, height 6\' 2"  (1.88 m), weight 185 lb 11.2 oz (84.2 kg), SpO2 98 %.   General:  Adult male lying in bed in NAD Neuro:  Flat, alert and oriented/ appropriate  CV: ST 103 PULM:  Regular/non-labored GI: Abd soft, NT/ND, BS+ Extremities: warm/dry   CBC    Component Value Date/Time   WBC 11.5 (H) 01/04/2017 0246   RBC 3.47 (L) 01/04/2017 0246   HGB 9.7 (L) 01/04/2017 0246   HCT 30.6 (L) 01/04/2017 0246   PLT 303 01/04/2017 0246   MCV 88.2 01/04/2017 0246   MCH 28.0 01/04/2017 0246   MCHC 31.7 01/04/2017 0246   RDW 16.1 (H) 01/04/2017 0246    BMET    Component Value Date/Time   NA 141 01/04/2017 0246   K 3.5 01/04/2017 0246   CL 107 01/04/2017 0246   CO2 26 01/04/2017 0246   GLUCOSE 154 (H) 01/04/2017 0246   BUN 18 01/04/2017 0246   CREATININE 2.13 (H) 01/04/2017 0246   CALCIUM 8.8 (L) 01/04/2017 0246   GFRNONAA 30 (L) 01/04/2017 0246   GFRAA 35 (L) 01/04/2017 0246    A:  HTN urgency with nausea  [facility] - unable to keep PO meds down - ? Related to HTN.  SBP has been ~180's, patient does not complain of nausea with prior BP control  - KUB 6/26 neg, patient denies abd or chest discomfort - EKG today with ? septal changes not previously noted on this admission - trended troponin 0.08-0.07 P:  Continue clonidine patch Adjusting IV HTN meds- with holding parameters  Increase lopressor IV to 10 mg q 6 hr Apresoline 20 mg q 6 hr Labetalol 10-20 mg q 4 hr prn  EKG now and troponin now  Assess TTE for wall motion abnormalities  Will continue to monitor BP throughout the night in SDU, might have to transition continuous gtt  Continue phenergan 12.5mg  q 6hrs Will assess to see if  nausea resolves with better BP control Monitor QTc with zofran - 0.433 this am Might need to consider additional etiologies for nausea if it does not resolve with BP control    Posey BoyerBrooke Jourdin Gens, AGACNP-BC [facility] Pgr: [phone] or if no answer [phone] 01/04/2017, 10:42 PM

## 2017-01-04 NOTE — Progress Notes (Addendum)
Pt's BP remains uncontrolled in spite of multiple med adjustments and multiple prn meds. NP called PCCM who agree for pt to go back to ICU on a Labetalol drip (pt was on this previously). Per Dr. Gwendolyn GrantFeinstein's note today, goal is 25% reduction in MAP over 2 days. MAP 136 now. Plan discussed with RN. Transfer order placed.  KJKG, NP [facility] Addendum: PCCM NP went to bedside and was able to lower BP with extra IV push BB. PCCM NP adjusting BP meds with goal of 150. Will leave pt on SDU for now.  KJKG, NP [facility] Update: Pt's BP fell after receiving higher doses of meds. 1L NS bolus given. Meds decreased by this NP with holding parameters. PCCM NP aware. BP up to 104 now. Pt feels weak but is oriented. Asked RN to call back if BP doesn't continue to rise after bolus.  KJKG, NP [facility]

## 2017-01-05 ENCOUNTER — Inpatient Hospital Stay (HOSPITAL_COMMUNITY): Payer: Medicare Other

## 2017-01-05 DIAGNOSIS — R110 Nausea: Secondary | ICD-10-CM

## 2017-01-05 DIAGNOSIS — Z951 Presence of aortocoronary bypass graft: Secondary | ICD-10-CM

## 2017-01-05 DIAGNOSIS — K922 Gastrointestinal hemorrhage, unspecified: Secondary | ICD-10-CM

## 2017-01-05 DIAGNOSIS — I5032 Chronic diastolic (congestive) heart failure: Secondary | ICD-10-CM

## 2017-01-05 DIAGNOSIS — I5030 Unspecified diastolic (congestive) heart failure: Secondary | ICD-10-CM

## 2017-01-05 DIAGNOSIS — N170 Acute kidney failure with tubular necrosis: Secondary | ICD-10-CM

## 2017-01-05 LAB — COMPREHENSIVE METABOLIC PANEL
ALBUMIN: 2.6 g/dL — AB (ref 3.5–5.0)
ALK PHOS: 64 U/L (ref 38–126)
ALT: 18 U/L (ref 17–63)
ANION GAP: 9 (ref 5–15)
AST: 20 U/L (ref 15–41)
BILIRUBIN TOTAL: 0.8 mg/dL (ref 0.3–1.2)
BUN: 15 mg/dL (ref 6–20)
CALCIUM: 8.4 mg/dL — AB (ref 8.9–10.3)
CO2: 24 mmol/L (ref 22–32)
CREATININE: 1.66 mg/dL — AB (ref 0.61–1.24)
Chloride: 104 mmol/L (ref 101–111)
GFR calc Af Amer: 48 mL/min — ABNORMAL LOW (ref >60)
GFR calc non Af Amer: 41 mL/min — ABNORMAL LOW (ref >60)
GLUCOSE: 250 mg/dL — AB (ref 65–99)
Potassium: 3.1 mmol/L — ABNORMAL LOW (ref 3.5–5.1)
Sodium: 137 mmol/L (ref 135–145)
TOTAL PROTEIN: 5.6 g/dL — AB (ref 6.5–8.1)

## 2017-01-05 LAB — CBC
HCT: 31.1 % — ABNORMAL LOW (ref 39.0–52.0)
HEMOGLOBIN: 10.2 g/dL — AB (ref 13.0–17.0)
MCH: 28.1 pg (ref 26.0–34.0)
MCHC: 32.8 g/dL (ref 30.0–36.0)
MCV: 85.7 fL (ref 78.0–100.0)
PLATELETS: 276 10*3/uL (ref 150–400)
RBC: 3.63 MIL/uL — ABNORMAL LOW (ref 4.22–5.81)
RDW: 15.6 % — AB (ref 11.5–15.5)
WBC: 7.7 10*3/uL (ref 4.0–10.5)

## 2017-01-05 LAB — GLUCOSE, CAPILLARY
GLUCOSE-CAPILLARY: 178 mg/dL — AB (ref 65–99)
GLUCOSE-CAPILLARY: 240 mg/dL — AB (ref 65–99)
Glucose-Capillary: 260 mg/dL — ABNORMAL HIGH (ref 65–99)

## 2017-01-05 LAB — ECHOCARDIOGRAM COMPLETE
HEIGHTINCHES: 74 in
WEIGHTICAEL: 2971.2 [oz_av]

## 2017-01-05 LAB — MAGNESIUM: Magnesium: 2.2 mg/dL (ref 1.7–2.4)

## 2017-01-05 MED ORDER — NITROGLYCERIN IN D5W 200-5 MCG/ML-% IV SOLN
0.0000 ug/min | INTRAVENOUS | Status: DC
  Administered 2017-01-05: 5 ug/min via INTRAVENOUS
  Administered 2017-01-06: 33 ug/min via INTRAVENOUS
  Administered 2017-01-07: 10 ug/min via INTRAVENOUS
  Filled 2017-01-05 (×3): qty 250

## 2017-01-05 MED ORDER — METOPROLOL TARTRATE 5 MG/5ML IV SOLN
5.0000 mg | Freq: Four times a day (QID) | INTRAVENOUS | Status: DC
  Administered 2017-01-05 – 2017-01-06 (×5): 5 mg via INTRAVENOUS
  Filled 2017-01-05 (×7): qty 5

## 2017-01-05 MED ORDER — HYDRALAZINE HCL 20 MG/ML IJ SOLN
10.0000 mg | Freq: Four times a day (QID) | INTRAMUSCULAR | Status: DC
  Administered 2017-01-05 – 2017-01-06 (×3): 10 mg via INTRAVENOUS
  Filled 2017-01-05 (×4): qty 1

## 2017-01-05 MED ORDER — HYDRALAZINE HCL 20 MG/ML IJ SOLN
10.0000 mg | Freq: Four times a day (QID) | INTRAMUSCULAR | Status: DC | PRN
  Administered 2017-01-05 – 2017-01-06 (×2): 10 mg via INTRAVENOUS
  Filled 2017-01-05 (×2): qty 1

## 2017-01-05 MED ORDER — LABETALOL HCL 5 MG/ML IV SOLN: 10.0000 mg | INTRAVENOUS | Status: DC | PRN

## 2017-01-05 MED ORDER — SODIUM CHLORIDE 0.9 % IV BOLUS (SEPSIS)
1000.0000 mL | Freq: Once | INTRAVENOUS | Status: AC
  Administered 2017-01-05: 1000 mL via INTRAVENOUS

## 2017-01-05 NOTE — Progress Notes (Signed)
PROGRESS NOTE    Brian LeatherwoodMelvin L Grimes  ZOX:[id number] DOB: 08/20/1950 DOA: 01/02/2017 PCP: System, Pcp Not In   Brief Narrative:  66 y.o.BM [facility] who had a f/u visit 4 days prior to this admit where he c/o generalized weakness and lightheadedness.  He was found to be hypotensive with BP 87/45 and his clonidine (at max dose) was abruptly discontinued (per his physician).  The day before his admit he developed intractable NV. Upon presentation to the ED he was found to have BP readings in the 200s/140s with HR 140s.   Subjective: 6/28 A/O 4, negative CP, negative SOB, positive nausea. States he was told by his physician to stop all of his clonidine, secondary to hypotension (SBP in 80s). This resulted in rebound HTN.    Assessment & Plan:   Principal Problem:   Hypertensive urgency Active Problems:   CKD stage 3 due to type 2 diabetes mellitus (HCC)   Nausea & vomiting   Lactic acidosis   Hyperglycemia due to type 2 diabetes mellitus (HCC)   CAD (coronary artery disease), native coronary artery   HTN urgency -Difficult to control BP -Catapres 0.2 mg -Hydralazine 10 mg QID  -Hydralazine PRN -Metoprolol 5 mg QID -Nitroglycerin drip -Per RN patient has had several episodes of hypotension overnight, will attempt to simplify patient's BP regimen while maintaining improved control.   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 -Negative CP -Denies SSCP at present - no apparent acute complications  Chronic diastolic CHF -Strict I&O -Daily weight -See hypertension urgency  Coffee ground emesis / UGIB -Clinically seems most consistent with Mallory-Weiss tear or esophagitis/gastritis in setting of recurrent episodes of retching/vomiting  - I suspect his nausea is asked to driven by his extreme hypertension  - we will utilize Phenergan when necessary and focus on good blood pressure control  - presently no indication for acute GI evaluation  -Monitor hemoglobin Recent  Labs Lab 01/02/17 1428 01/03/17 0032 01/03/17 0419 01/04/17 0246 01/05/17 0242  HGB 12.2* 11.4* 10.9* 9.7* 10.2*  -Stable  Acute renal failure on CKD Stage 3(Baseline crt 1.5)  - suspect injury related to HTN  - follow trend w/ ongoing attempts to manage BP Lab Results  Component Value Date   CREATININE 1.66 (H) 01/05/2017   CREATININE 2.13 (H) 01/04/2017   CREATININE 1.76 (H) 01/03/2017  -Patient with difficulty control BP, elevated diastolic BP consider renal artery stenosis. Obtain renal artery ultrasound    DVT prophylaxis: SCD Code Status: Full Family Communication: Daughter present at bedside for discussion of plan of care Disposition Plan: Resolution of hypertensive urgency   [facility]  [facility]   Procedures/Significant Events:  6/25 admit  6/27 TRH resumed care  6/28 Echocardiogram:Left ventricle:  mild LVH. -LVEF =60% to 65%. -(grade 1 diastolic dysfunction).   VENTILATOR SETTINGS: None   Cultures None  Antimicrobials: None   Devices None   LINES / TUBES:  None    Continuous Infusions: . sodium chloride 40 mL/hr at 01/05/17 1500  . nitroGLYCERIN 20 mcg/min (01/05/17 1700)     Objective: Vitals:   01/05/17 1305 01/05/17 1545 01/05/17 1714 01/05/17 1751  BP: (!) 193/113 (!) 176/115 (!) 178/116 (!) 186/125  Pulse: (!) 107 (!) 107 (!) 109   Resp: 12 13 14    Temp: 99.2 F (37.3 C)  99.5 F (37.5 C)   TempSrc: Oral  Oral   SpO2: 99% 99% 99%   Weight:      Height:  Intake/Output Summary (Last 24 hours) at 01/05/17 1812 Last data filed at 01/05/17 1540  Gross per 24 hour  Intake             1936 ml  Output              950 ml  Net              986 ml   Filed Weights   01/03/17 0350 01/04/17 0500 01/05/17 0519  Weight: 192 lb 3.9 oz (87.2 kg) 185 lb 11.2 oz (84.2 kg) 185 lb 11.2 oz (84.2 kg)    Examination:  General: A/O 4, No acute respiratory distress Eyes: negative scleral hemorrhage, negative anisocoria,  negative icterus ENT: Negative Runny nose, negative gingival bleeding, Neck:  Negative scars, masses, torticollis, lymphadenopathy, JVD Lungs: Clear to auscultation bilaterally without wheezes or crackles Cardiovascular: Regular rate and rhythm without murmur gallop or rub normal S1 and S2 Abdomen: negative abdominal pain, nondistended, positive soft, bowel sounds, no rebound, no ascites, no appreciable mass Extremities: No significant cyanosis, clubbing, or edema bilateral lower extremities Skin: Negative rashes, lesions, ulcers Psychiatric:  Negative depression, negative anxiety, negative fatigue, negative mania  Central nervous system:  Cranial nerves II through XII intact, tongue/uvula midline, all extremities muscle strength 5/5, sensation intact throughout, negative dysarthria, negative expressive aphasia, negative receptive aphasia.  .     Data Reviewed: Care during the described time interval was provided by me .  I have reviewed this patient's available data, including medical history, events of note, physical examination, and all test results as part of my evaluation. I have personally reviewed and interpreted all radiology studies.  CBC:  Recent Labs Lab 01/02/17 1428 01/03/17 0032 01/03/17 0419 01/04/17 0246 01/05/17 0242  WBC 9.6 10.2 10.6* 11.5* 7.7  HGB 12.2* 11.4* 10.9* 9.7* 10.2*  HCT 36.3* 34.5* 33.7* 30.6* 31.1*  MCV 85.6 86.0 87.1 88.2 85.7  PLT 428* 359 359 303 276   Basic Metabolic Panel:  Recent Labs Lab 01/02/17 1428 01/03/17 0032 01/03/17 0419 01/04/17 0246 01/05/17 0242  NA 140  --  141 141 137  K 3.6  --  3.3* 3.5 3.1*  CL 98*  --  105 107 104  CO2 19*  --  26 26 24   GLUCOSE 446*  --  277* 154* 250*  BUN 16  --  16 18 15   CREATININE 1.89*  --  1.76* 2.13* 1.66*  CALCIUM 10.0  --  8.9 8.8* 8.4*  MG  --  1.3* 1.5*  --  2.2  PHOS  --   --  3.3  --   --    GFR: Estimated Creatinine Clearance: 50.2 mL/min (A) (by C-G formula based on SCr of  1.66 mg/dL (H)). Liver Function Tests:  Recent Labs Lab 01/02/17 1428 01/04/17 0246 01/05/17 0242  AST 27 16 20   ALT 14* 10* 18  ALKPHOS 81 57 64  BILITOT 1.1 0.9 0.8  PROT 8.9* 5.8* 5.6*  ALBUMIN 4.2 3.0* 2.6*   No results for input(s): LIPASE, AMYLASE in the last 168 hours. No results for input(s): AMMONIA in the last 168 hours. Coagulation Profile: No results for input(s): INR, PROTIME in the last 168 hours. Cardiac Enzymes:  Recent Labs Lab 01/03/17 0032 01/03/17 0419 01/03/17 1009 01/04/17 2301  TROPONINI 0.08* 0.08* 0.07* 0.05*   BNP (last 3 results) No results for input(s): PROBNP in the last 8760 hours. HbA1C: No results for input(s): HGBA1C in the last 72 hours. CBG:  Recent  Labs Lab 01/04/17 1310 01/04/17 1742 01/04/17 2147 01/05/17 0800 01/05/17 1304  GLUCAP 259* 191* 177* 178* 260*   Lipid Profile: No results for input(s): CHOL, HDL, LDLCALC, TRIG, CHOLHDL, LDLDIRECT in the last 72 hours. Thyroid Function Tests: No results for input(s): TSH, T4TOTAL, FREET4, T3FREE, THYROIDAB in the last 72 hours. Anemia Panel: No results for input(s): VITAMINB12, FOLATE, FERRITIN, TIBC, IRON, RETICCTPCT in the last 72 hours. Urine analysis:    Component Value Date/Time   COLORURINE STRAW (A) 01/02/2017 1853   APPEARANCEUR CLEAR 01/02/2017 1853   LABSPEC 1.016 01/02/2017 1853   PHURINE 5.0 01/02/2017 1853   GLUCOSEU >=500 (A) 01/02/2017 1853   HGBUR MODERATE (A) 01/02/2017 1853   BILIRUBINUR NEGATIVE 01/02/2017 1853   KETONESUR 20 (A) 01/02/2017 1853   PROTEINUR >=300 (A) 01/02/2017 1853   NITRITE NEGATIVE 01/02/2017 1853   LEUKOCYTESUR NEGATIVE 01/02/2017 1853   Sepsis Labs: @LABRCNTIP (procalcitonin:4,lacticidven:4)  ) Recent Results (from the past 240 hour(s))  Urine culture     Status: None   Collection Time: 01/02/17  6:53 PM  Result Value Ref Range Status   Specimen Description URINE, RANDOM  Final   Special Requests NONE  Final   Culture NO  GROWTH  Final   Report Status 01/04/2017 FINAL  Final  Blood culture (routine x 2)     Status: None (Preliminary result)   Collection Time: 01/02/17  7:45 PM  Result Value Ref Range Status   Specimen Description BLOOD RIGHT HAND  Final   Special Requests   Final    BOTTLES DRAWN AEROBIC AND ANAEROBIC Blood Culture adequate volume   Culture NO GROWTH 3 DAYS  Final   Report Status PENDING  Incomplete  Blood culture (routine x 2)     Status: None (Preliminary result)   Collection Time: 01/02/17  7:50 PM  Result Value Ref Range Status   Specimen Description BLOOD LEFT HAND  Final   Special Requests   Final    BOTTLES DRAWN AEROBIC AND ANAEROBIC Blood Culture adequate volume   Culture NO GROWTH 3 DAYS  Final   Report Status PENDING  Incomplete  MRSA PCR Screening     Status: None   Collection Time: 01/03/17 12:13 AM  Result Value Ref Range Status   MRSA by PCR NEGATIVE NEGATIVE Final    Comment:        The GeneXpert MRSA Assay (FDA approved for NASAL specimens only), is one component of a comprehensive MRSA colonization surveillance program. It is not intended to diagnose MRSA infection nor to guide or monitor treatment for MRSA infections.          Radiology Studies: No results found.      Scheduled Meds: . cloNIDine  0.2 mg Transdermal Weekly  . feeding supplement  1 Container Oral BID BM  . hydrALAZINE  10 mg Intravenous Q6H  . insulin aspart  0-15 Units Subcutaneous TID WC  . insulin aspart  0-5 Units Subcutaneous QHS  . metoprolol tartrate  5 mg Intravenous Q6H  . pantoprazole (PROTONIX) IV  40 mg Intravenous Q12H  . sodium chloride flush  3 mL Intravenous Q12H   Continuous Infusions: . sodium chloride 40 mL/hr at 01/05/17 1500  . nitroGLYCERIN 20 mcg/min (01/05/17 1700)     LOS: 3 days    Time spent: 40 minutes    WOODS, Roselind Messier, MD [facility] Pager [phone]   If 7PM-7AM, please contact night-coverage [email or web address] Password  [facility] 01/05/2017, 6:12 PM

## 2017-01-05 NOTE — Progress Notes (Signed)
  Echocardiogram 2D Echocardiogram has been performed.  Brian Grimes, [name] M 01/05/2017, 8:13 AM

## 2017-01-06 ENCOUNTER — Inpatient Hospital Stay (HOSPITAL_COMMUNITY): Payer: Medicare Other

## 2017-01-06 DIAGNOSIS — I160 Hypertensive urgency: Secondary | ICD-10-CM

## 2017-01-06 LAB — LIPID PANEL
CHOLESTEROL: 122 mg/dL (ref 0–200)
HDL: 53 mg/dL (ref >40)
LDL Cholesterol: 48 mg/dL (ref 0–99)
TRIGLYCERIDES: 104 mg/dL (ref <150)
Total CHOL/HDL Ratio: 2.3 RATIO
VLDL: 21 mg/dL (ref 0–40)

## 2017-01-06 LAB — BASIC METABOLIC PANEL
ANION GAP: 9 (ref 5–15)
BUN: 17 mg/dL (ref 6–20)
CO2: 23 mmol/L (ref 22–32)
Calcium: 9 mg/dL (ref 8.9–10.3)
Chloride: 98 mmol/L — ABNORMAL LOW (ref 101–111)
Creatinine, Ser: 1.51 mg/dL — ABNORMAL HIGH (ref 0.61–1.24)
GFR, EST AFRICAN AMERICAN: 53 mL/min — AB (ref >60)
GFR, EST NON AFRICAN AMERICAN: 46 mL/min — AB (ref >60)
Glucose, Bld: 427 mg/dL — ABNORMAL HIGH (ref 65–99)
Potassium: 3.3 mmol/L — ABNORMAL LOW (ref 3.5–5.1)
SODIUM: 130 mmol/L — AB (ref 135–145)

## 2017-01-06 LAB — GLUCOSE, CAPILLARY
GLUCOSE-CAPILLARY: 268 mg/dL — AB (ref 65–99)
Glucose-Capillary: 217 mg/dL — ABNORMAL HIGH (ref 65–99)
Glucose-Capillary: 394 mg/dL — ABNORMAL HIGH (ref 65–99)
Glucose-Capillary: 141 mg/dL — ABNORMAL HIGH (ref 65–99)

## 2017-01-06 LAB — MAGNESIUM: MAGNESIUM: 1.8 mg/dL (ref 1.7–2.4)

## 2017-01-06 MED ORDER — HYDRALAZINE HCL 20 MG/ML IJ SOLN
15.0000 mg | INTRAMUSCULAR | Status: DC | PRN
  Administered 2017-01-07 – 2017-01-08 (×5): 15 mg via INTRAVENOUS
  Filled 2017-01-06 (×6): qty 1

## 2017-01-06 MED ORDER — METOPROLOL TARTRATE 50 MG PO TABS
100.0000 mg | ORAL_TABLET | Freq: Two times a day (BID) | ORAL | Status: DC
  Administered 2017-01-06: 100 mg via ORAL
  Filled 2017-01-06: qty 2

## 2017-01-06 MED ORDER — METOPROLOL TARTRATE 50 MG PO TABS: 50.0000 mg | ORAL_TABLET | Freq: Two times a day (BID) | ORAL | Status: DC

## 2017-01-06 MED ORDER — METOPROLOL TARTRATE 25 MG PO TABS: 25.0000 mg | ORAL_TABLET | Freq: Two times a day (BID) | ORAL | Status: DC

## 2017-01-06 MED ORDER — HYDRALAZINE HCL 20 MG/ML IJ SOLN
15.0000 mg | Freq: Four times a day (QID) | INTRAMUSCULAR | Status: DC | PRN
  Administered 2017-01-06: 15 mg via INTRAVENOUS
  Filled 2017-01-06: qty 1

## 2017-01-06 MED ORDER — METOPROLOL TARTRATE 5 MG/5ML IV SOLN
5.0000 mg | Freq: Once | INTRAVENOUS | Status: AC
  Administered 2017-01-06: 5 mg via INTRAVENOUS
  Filled 2017-01-06: qty 5

## 2017-01-06 NOTE — Progress Notes (Signed)
PROGRESS NOTE    Brian Grimes  ZOX:[id number] DOB: 11/18/1950 DOA: 01/02/2017 PCP: System, Pcp Not In   Brief Narrative:  66 y.o.BM PMHx CABG in May 2018 at [facility] who had a f/u visit 4 days prior to this admit where he c/o generalized weakness and lightheadedness.  He was found to be hypotensive with BP 87/45 and his clonidine (at max dose) was abruptly discontinued (per his physician).  The day before his admit he developed intractable NV. Upon presentation to the ED he was found to have BP readings in the 200s/140s with HR 140s.   Subjective: 6/29 A/O 4, negative CP, negative SOB, positive nausea. States he was told by his physician to stop all of his clonidine, secondary to hypotension (SBP in 80s). This resulted in rebound HTN.    Assessment & Plan:   Principal Problem:   Hypertensive urgency Active Problems:   CKD stage 3 due to type 2 diabetes mellitus (HCC)   Nausea & vomiting   Lactic acidosis   Hyperglycemia due to type 2 diabetes mellitus (HCC)   CAD (coronary artery disease), native coronary artery   HTN urgency -Difficult to control BP -Catapres 0.2 mg -Hydralazine PRN -6/29 Metoprolol 100 mg BID -Metoprolol IV 5 mg 1 -Nitroglycerin drip -Will stay away from ACE I or ARB secondary to CKD -Simplify patient's BP regimen while maintaining improved control.   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 -Negative CP -Hemoglobin A1c pending -Lipid panel pending  Chronic diastolic CHF -Strict I&O since admission +2.8 L -Daily weight -See hypertension urgency  Coffee ground emesis / UGIB -Clinically seems most consistent with Mallory-Weiss tear or esophagitis/gastritis in setting of recurrent episodes of retching/vomiting  - I suspect his nausea is asked to driven by his extreme hypertension  - we will utilize Phenergan when necessary and focus on good blood pressure control  - presently no indication for acute GI evaluation  -Monitor hemoglobin  Recent Labs Lab  01/02/17 1428 01/03/17 0032 01/03/17 0419 01/04/17 0246 01/05/17 0242  HGB 12.2* 11.4* 10.9* 9.7* 10.2*  -Stable  Acute renal failure on CKD Stage 3(Baseline crt 1.5)  - suspect injury related to HTN  - follow trend w/ ongoing attempts to manage BP Lab Results  Component Value Date   CREATININE 1.66 (H) 01/05/2017   CREATININE 2.13 (H) 01/04/2017   CREATININE 1.76 (H) 01/03/2017  -Patient with difficulty control BP, elevated diastolic BP consider renal artery stenosis. Obtain renal artery ultrasound    DVT prophylaxis: SCD Code Status: Full Family Communication: Daughter present at bedside for discussion of plan of care Disposition Plan: Resolution of hypertensive urgency   Consultants:  [facility] M   Procedures/Significant Events:  6/25 admit  6/27 TRH resumed care  6/28 Echocardiogram:Left ventricle:  mild LVH. -LVEF =60% to 65%. -(grade 1 diastolic dysfunction). 6/29 Renal Artery Doppler: Negative renal artery stenosis noted bilaterally.    VENTILATOR SETTINGS: None   Cultures None  Antimicrobials: None   Devices None   LINES / TUBES:  None    Continuous Infusions: . sodium chloride 40 mL/hr at 01/05/17 1900  . nitroGLYCERIN 10 mcg/min (01/06/17 1254)     Objective: Vitals:   01/06/17 0622 01/06/17 0900 01/06/17 0945 01/06/17 1147  BP:   (!) 158/105 (!) 168/115  Pulse:    (!) 127  Resp:    17  Temp:  99 F (37.2 C)  98.9 F (37.2 C)  TempSrc:  Oral  Oral  SpO2:    98%  Weight:  190 lb 7.6 oz (86.4 kg)     Height:        Intake/Output Summary (Last 24 hours) at 01/06/17 1519 Last data filed at 01/06/17 1254  Gross per 24 hour  Intake           829.54 ml  Output              550 ml  Net           279.54 ml   Filed Weights   01/04/17 0500 01/05/17 0519 01/06/17 0622  Weight: 185 lb 11.2 oz (84.2 kg) 185 lb 11.2 oz (84.2 kg) 190 lb 7.6 oz (86.4 kg)    Examination:  General: A/O 4, No acute respiratory distress Eyes: negative  scleral hemorrhage, negative anisocoria, negative icterus ENT: Negative Runny nose, negative gingival bleeding, Neck:  Negative scars, masses, torticollis, lymphadenopathy, JVD Lungs: Clear to auscultation bilaterally without wheezes or crackles Cardiovascular: Regular rate and rhythm without murmur gallop or rub normal S1 and S2 Abdomen: negative abdominal pain, nondistended, positive soft, bowel sounds, no rebound, no ascites, no appreciable mass Extremities: No significant cyanosis, clubbing, or edema bilateral lower extremities Skin: Negative rashes, lesions, ulcers Psychiatric:  Negative depression, negative anxiety, negative fatigue, negative mania  Central nervous system:  Cranial nerves II through XII intact, tongue/uvula midline, all extremities muscle strength 5/5, sensation intact throughout, negative dysarthria, negative expressive aphasia, negative receptive aphasia.  .     Data Reviewed: Care during the described time interval was provided by me .  I have reviewed this patient's available data, including medical history, events of note, physical examination, and all test results as part of my evaluation. I have personally reviewed and interpreted all radiology studies.  CBC:  Recent Labs Lab 01/02/17 1428 01/03/17 0032 01/03/17 0419 01/04/17 0246 01/05/17 0242  WBC 9.6 10.2 10.6* 11.5* 7.7  HGB 12.2* 11.4* 10.9* 9.7* 10.2*  HCT 36.3* 34.5* 33.7* 30.6* 31.1*  MCV 85.6 86.0 87.1 88.2 85.7  PLT 428* 359 359 303 276   Basic Metabolic Panel:  Recent Labs Lab 01/02/17 1428 01/03/17 0032 01/03/17 0419 01/04/17 0246 01/05/17 0242  NA 140  --  141 141 137  K 3.6  --  3.3* 3.5 3.1*  CL 98*  --  105 107 104  CO2 19*  --  26 26 24   GLUCOSE 446*  --  277* 154* 250*  BUN 16  --  16 18 15   CREATININE 1.89*  --  1.76* 2.13* 1.66*  CALCIUM 10.0  --  8.9 8.8* 8.4*  MG  --  1.3* 1.5*  --  2.2  PHOS  --   --  3.3  --   --    GFR: Estimated Creatinine Clearance: 50.2  mL/min (A) (by C-G formula based on SCr of 1.66 mg/dL (H)). Liver Function Tests:  Recent Labs Lab 01/02/17 1428 01/04/17 0246 01/05/17 0242  AST 27 16 20   ALT 14* 10* 18  ALKPHOS 81 57 64  BILITOT 1.1 0.9 0.8  PROT 8.9* 5.8* 5.6*  ALBUMIN 4.2 3.0* 2.6*   No results for input(s): LIPASE, AMYLASE in the last 168 hours. No results for input(s): AMMONIA in the last 168 hours. Coagulation Profile: No results for input(s): INR, PROTIME in the last 168 hours. Cardiac Enzymes:  Recent Labs Lab 01/03/17 0032 01/03/17 0419 01/03/17 1009 01/04/17 2301  TROPONINI 0.08* 0.08* 0.07* 0.05*   BNP (last 3 results) No results for input(s): PROBNP in the last 8760 hours. HbA1C:  No results for input(s): HGBA1C in the last 72 hours. CBG:  Recent Labs Lab 01/04/17 2147 01/05/17 0800 01/05/17 1304 01/05/17 1713 01/06/17 1148  GLUCAP 177* 178* 260* 240* 268*   Lipid Profile: No results for input(s): CHOL, HDL, LDLCALC, TRIG, CHOLHDL, LDLDIRECT in the last 72 hours. Thyroid Function Tests: No results for input(s): TSH, T4TOTAL, FREET4, T3FREE, THYROIDAB in the last 72 hours. Anemia Panel: No results for input(s): VITAMINB12, FOLATE, FERRITIN, TIBC, IRON, RETICCTPCT in the last 72 hours. Urine analysis:    Component Value Date/Time   COLORURINE STRAW (A) 01/02/2017 1853   APPEARANCEUR CLEAR 01/02/2017 1853   LABSPEC 1.016 01/02/2017 1853   PHURINE 5.0 01/02/2017 1853   GLUCOSEU >=500 (A) 01/02/2017 1853   HGBUR MODERATE (A) 01/02/2017 1853   BILIRUBINUR NEGATIVE 01/02/2017 1853   KETONESUR 20 (A) 01/02/2017 1853   PROTEINUR >=300 (A) 01/02/2017 1853   NITRITE NEGATIVE 01/02/2017 1853   LEUKOCYTESUR NEGATIVE 01/02/2017 1853   Sepsis Labs: @LABRCNTIP (procalcitonin:4,lacticidven:4)  ) Recent Results (from the past 240 hour(s))  Urine culture     Status: None   Collection Time: 01/02/17  6:53 PM  Result Value Ref Range Status   Specimen Description URINE, RANDOM  Final    Special Requests NONE  Final   Culture NO GROWTH  Final   Report Status 01/04/2017 FINAL  Final  Blood culture (routine x 2)     Status: None (Preliminary result)   Collection Time: 01/02/17  7:45 PM  Result Value Ref Range Status   Specimen Description BLOOD RIGHT HAND  Final   Special Requests   Final    BOTTLES DRAWN AEROBIC AND ANAEROBIC Blood Culture adequate volume   Culture NO GROWTH 3 DAYS  Final   Report Status PENDING  Incomplete  Blood culture (routine x 2)     Status: None (Preliminary result)   Collection Time: 01/02/17  7:50 PM  Result Value Ref Range Status   Specimen Description BLOOD LEFT HAND  Final   Special Requests   Final    BOTTLES DRAWN AEROBIC AND ANAEROBIC Blood Culture adequate volume   Culture NO GROWTH 3 DAYS  Final   Report Status PENDING  Incomplete  MRSA PCR Screening     Status: None   Collection Time: 01/03/17 12:13 AM  Result Value Ref Range Status   MRSA by PCR NEGATIVE NEGATIVE Final    Comment:        The GeneXpert MRSA Assay (FDA approved for NASAL specimens only), is one component of a comprehensive MRSA colonization surveillance program. It is not intended to diagnose MRSA infection nor to guide or monitor treatment for MRSA infections.          Radiology Studies: No results found.      Scheduled Meds: . cloNIDine  0.2 mg Transdermal Weekly  . feeding supplement  1 Container Oral BID BM  . hydrALAZINE  10 mg Intravenous Q6H  . insulin aspart  0-15 Units Subcutaneous TID WC  . insulin aspart  0-5 Units Subcutaneous QHS  . metoprolol tartrate  5 mg Intravenous Q6H  . pantoprazole (PROTONIX) IV  40 mg Intravenous Q12H  . sodium chloride flush  3 mL Intravenous Q12H   Continuous Infusions: . sodium chloride 40 mL/hr at 01/05/17 1900  . nitroGLYCERIN 10 mcg/min (01/06/17 1254)     LOS: 4 days    Time spent: 40 minutes    [place] Chelf, Roselind MessierURTIS J, MD [facility] Pager [phone]   If 7PM-7AM, please contact  night-coverage [email or web address] Password [facility] 01/06/2017, 3:19 PM

## 2017-01-06 NOTE — Progress Notes (Signed)
Physical Therapy Treatment Patient Details Name: Brian Grimes MRN: [id number] DOB: 10/17/1950 Today's Date: 01/06/2017    History of Present Illness 66 year old male with PMH of BPH, DM, HTN, S/P 3 Vessel CABG on 11/25/16 on plavix (course complicated by HTN) Admitted 6/25 with  emesis and progressive weakness.     PT Comments    Pt progressing towards physical therapy goals. Was able to perform transfers and ambulation with gross modified independence and no AD. Appears that pt in a more cheerful mood after session, wanting to sit up in chair and watch some television. Upon PT arrival, pt was sitting up in bed with silence in the room. Pt asking for PT to return to walk with him again. Encouraged ambulation with staff between therapy sessions - will likely need only one more session from an acute PT standpoint. Will continue to follow.   Follow Up Recommendations  No PT follow up;Supervision/Assistance - 24 hour     Equipment Recommendations  None recommended by PT    Recommendations for Other Services       Precautions / Restrictions Precautions Precautions: Sternal;Fall (from recent CABG) Precaution Comments: pt able to recall and demo'd carryover with function Restrictions Weight Bearing Restrictions: No    Mobility  Bed Mobility Overal bed mobility: Modified Independent             General bed mobility comments: pt did not push/pull, HOB was flat, no physical assist needed  Transfers Overall transfer level: Modified independent Equipment used: None Transfers: Sit to/from Stand           General transfer comment: pt demo'd good technique. pushed up from thighs instead of seated surface to adhere to sternal precautions.   Ambulation/Gait Ambulation/Gait assistance: Modified independent (Device/Increase time) Ambulation Distance (Feet): 300 Feet Assistive device: None Gait Pattern/deviations: Step-through pattern;Decreased stride length;Staggering  right;Staggering left Gait velocity: Decreased   General Gait Details: Pt ambulated well - slow but steady. Likely that pt has a casual, decreased gait speed at baseline. Assist only for management of lines/IV pole.    Stairs            Wheelchair Mobility    Modified Rankin (Stroke Patients Only)       Balance Overall balance assessment: No apparent balance deficits (not formally assessed)                                          Cognition Arousal/Alertness: Awake/alert Behavior During Therapy: Flat affect Overall Cognitive Status: Within Functional Limits for tasks assessed                                        Exercises      General Comments        Pertinent Vitals/Pain Pain Assessment: No/denies pain    Home Living                      Prior Function            PT Goals (current goals can now be found in the care plan section) Acute Rehab PT Goals Patient Stated Goal: return home ASAP PT Goal Formulation: With patient Time For Goal Achievement: 01/11/17 Potential to Achieve Goals: Good Progress towards PT goals: Progressing toward goals  Frequency    Min 2X/week      PT Plan Current plan remains appropriate    Co-evaluation              AM-PAC PT "6 Clicks" Daily Activity  Outcome Measure  Difficulty turning over in bed (including adjusting bedclothes, sheets and blankets)?: None Difficulty moving from lying on back to sitting on the side of the bed? : None Difficulty sitting down on and standing up from a chair with arms (e.g., wheelchair, bedside commode, etc,.)?: None Help needed moving to and from a bed to chair (including a wheelchair)?: A Little Help needed walking in hospital room?: A Little Help needed climbing 3-5 steps with a railing? : A Little 6 Click Score: 21    End of Session Equipment Utilized During Treatment: Gait belt Activity Tolerance: Patient tolerated  treatment well Patient left: in chair;with call bell/phone within reach Nurse Communication: Mobility status PT Visit Diagnosis: Unsteadiness on feet (R26.81);Muscle weakness (generalized) (M62.81)     Time: [id number] PT Time Calculation (min) (ACUTE ONLY): 25 min  Charges:  $Gait Training: 23-37 mins                    G Codes:       Conni SlipperLaura Hussain Maimone, PT, DPT Acute Rehabilitation Services Pager: [phone]   Marylynn PearsonLaura D Cielo Arias 01/06/2017, 11:52 AM

## 2017-01-06 NOTE — Progress Notes (Signed)
**  Preliminary report by tech**  Renal artery duplex complete. No evidence of renal artery stenosis noted bilaterally. Bilateral normal intrarenal resistive indices.   01/06/17 9:38 AM Olen CordialGreg Genaro Bekker RVT

## 2017-01-06 NOTE — Plan of Care (Signed)
Problem: Skin Integrity: Goal: Risk for impaired skin integrity will decrease Outcome: Progressing Skin assessed.  No areas of redness or breakdown noted.  Patient is able to reposition self frequently and understands to notify nurse of any changes in skin integrity.

## 2017-01-07 DIAGNOSIS — E118 Type 2 diabetes mellitus with unspecified complications: Secondary | ICD-10-CM

## 2017-01-07 DIAGNOSIS — E876 Hypokalemia: Secondary | ICD-10-CM

## 2017-01-07 LAB — CULTURE, BLOOD (ROUTINE X 2)
Culture: NO GROWTH
Culture: NO GROWTH
SPECIAL REQUESTS: ADEQUATE
Special Requests: ADEQUATE

## 2017-01-07 LAB — BASIC METABOLIC PANEL
ANION GAP: 10 (ref 5–15)
BUN: 13 mg/dL (ref 6–20)
CALCIUM: 9.2 mg/dL (ref 8.9–10.3)
CO2: 25 mmol/L (ref 22–32)
Chloride: 102 mmol/L (ref 101–111)
Creatinine, Ser: 1.3 mg/dL — ABNORMAL HIGH (ref 0.61–1.24)
GFR, EST NON AFRICAN AMERICAN: 55 mL/min — AB (ref >60)
Glucose, Bld: 203 mg/dL — ABNORMAL HIGH (ref 65–99)
Potassium: 3.1 mmol/L — ABNORMAL LOW (ref 3.5–5.1)
SODIUM: 137 mmol/L (ref 135–145)

## 2017-01-07 LAB — GLUCOSE, CAPILLARY
GLUCOSE-CAPILLARY: 312 mg/dL — AB (ref 65–99)
GLUCOSE-CAPILLARY: 311 mg/dL — AB (ref 65–99)
Glucose-Capillary: 308 mg/dL — ABNORMAL HIGH (ref 65–99)

## 2017-01-07 LAB — HEMOGLOBIN A1C
HEMOGLOBIN A1C: 8 % — AB (ref 4.8–5.6)
MEAN PLASMA GLUCOSE: 183 mg/dL

## 2017-01-07 LAB — MAGNESIUM: MAGNESIUM: 1.7 mg/dL (ref 1.7–2.4)

## 2017-01-07 MED ORDER — POTASSIUM CHLORIDE CRYS ER 20 MEQ PO TBCR
60.0000 meq | EXTENDED_RELEASE_TABLET | Freq: Once | ORAL | Status: AC
  Administered 2017-01-07: 60 meq via ORAL
  Filled 2017-01-07: qty 3

## 2017-01-07 MED ORDER — ISOSORBIDE DINITRATE 10 MG PO TABS
20.0000 mg | ORAL_TABLET | Freq: Two times a day (BID) | ORAL | Status: DC
  Administered 2017-01-07 (×2): 20 mg via ORAL
  Filled 2017-01-07 (×2): qty 2

## 2017-01-07 MED ORDER — MAGNESIUM SULFATE IN D5W 1-5 GM/100ML-% IV SOLN
1.0000 g | Freq: Once | INTRAVENOUS | Status: AC
  Administered 2017-01-07: 1 g via INTRAVENOUS
  Filled 2017-01-07: qty 100

## 2017-01-07 MED ORDER — METOPROLOL TARTRATE 5 MG/5ML IV SOLN
5.0000 mg | Freq: Once | INTRAVENOUS | Status: AC
  Administered 2017-01-07: 5 mg via INTRAVENOUS
  Filled 2017-01-07: qty 5

## 2017-01-07 MED ORDER — GLIPIZIDE 5 MG PO TABS
5.0000 mg | ORAL_TABLET | Freq: Every day | ORAL | Status: DC
  Administered 2017-01-07 – 2017-01-08 (×2): 5 mg via ORAL
  Filled 2017-01-07 (×2): qty 1

## 2017-01-07 MED ORDER — METOPROLOL TARTRATE 5 MG/5ML IV SOLN: 5.0000 mg | Freq: Once | INTRAVENOUS | Status: DC

## 2017-01-07 MED ORDER — METOPROLOL TARTRATE 50 MG PO TABS
150.0000 mg | ORAL_TABLET | Freq: Two times a day (BID) | ORAL | Status: DC
  Administered 2017-01-07 – 2017-01-09 (×5): 150 mg via ORAL
  Filled 2017-01-07 (×5): qty 3

## 2017-01-07 NOTE — Progress Notes (Signed)
Pt taken off NTG gtt this AM d/t ineffectiveness w/BP control and removal of both IVs d/t dislodgement.  Two new IVs started; pt administered PO meds w/adjustments to antihypertensives.  One dose of PRN IV Hydralazine given for SBP 190s along w/PO meds.  Pt stable at this time w/BP 122/79 (91); off the NTG gtt.  Will continue to monitor closely.

## 2017-01-07 NOTE — Progress Notes (Signed)
Patient's BP elevated, prn hydralazine given.  BP remained elevated after administering hydralazine.  MD notified.

## 2017-01-07 NOTE — Progress Notes (Signed)
PROGRESS NOTE    Brian Grimes  ZOX:[id number] DOB: 1950/10/28 DOA: 01/02/2017 PCP: System, Pcp Not In   Brief Narrative:  66 y.o.BM PMHx CABG in May 2018 at [facility] who had a f/u visit 4 days prior to this admit where he c/o generalized weakness and lightheadedness.  He was found to be hypotensive with BP 87/45 and his clonidine (at max dose) was abruptly discontinued (per his physician).  The day before his admit he developed intractable NV. Upon presentation to the ED he was found to have BP readings in the 200s/140s with HR 140s.   Subjective: 6/30  A/O 4, negative CP, negative SOB, negative nausea.     Assessment & Plan:   Principal Problem:   Hypertensive urgency Active Problems:   CKD stage 3 due to type 2 diabetes mellitus (HCC)   Nausea & vomiting   Lactic acidosis   Hyperglycemia due to type 2 diabetes mellitus (HCC)   CAD (coronary artery disease), native coronary artery   HTN urgency -Difficult to control BP -Catapres 0.2 mg -Hydralazine PRN -6/30 increased Metoprolol 150 mg BID -6/30 Start Isosorbide dinitrate -Metoprolol IV 5 mg 1 -Nitroglycerin drip -Will stay away from ACE I or ARB secondary to CKD -Simplify patient's BP regimen while maintaining improved control.   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 -Negative CP -6/29 Hemoglobin A1c= 8.0 -Lipid panel: Within ADA guidelines  Chronic diastolic CHF -Strict I&O since admission +2.8 L -Daily weight -See hypertension urgency  Diabetes type 2 uncontrolled with complications -6/29 Hemoglobin A1c= 8.0 -Start Glipizide 5 mg daily -Moderate SSI  Coffee ground emesis / UGIB -Clinically seems most consistent with Mallory-Weiss tear or esophagitis/gastritis in setting of recurrent episodes of retching/vomiting  - I suspect his nausea is asked to driven by his extreme hypertension  - we will utilize Phenergan when necessary and focus on good blood pressure control  - presently no indication for acute GI  evaluation  -Monitor hemoglobin Recent Labs Lab 01/02/17 1428 01/03/17 0032 01/03/17 0419 01/04/17 0246 01/05/17 0242  HGB 12.2* 11.4* 10.9* 9.7* 10.2*  -Stable  Acute renal failure on CKD Stage 3(Baseline crt 1.5)  - suspect injury related to HTN  - follow trend w/ ongoing attempts to manage BP Lab Results  Component Value Date   CREATININE 1.30 (H) 01/07/2017   CREATININE 1.51 (H) 01/06/2017   CREATININE 1.66 (H) 01/05/2017  -At baseline   Hypokalemia -Potassium goal> 4 -K-Dur 60 meq   Hypomagnesemia -Magnesium goal>2 -Magnesium IV 1gm     DVT prophylaxis: SCD Code Status: Full Family Communication: Daughter present at bedside for discussion of plan of care Disposition Plan: Resolution of hypertensive urgency   Consultants:  [facility] M   Procedures/Significant Events:  6/25 admit  6/27 TRH resumed care  6/28 Echocardiogram:Left ventricle:  mild LVH. -LVEF =60% to 65%. -(grade 1 diastolic dysfunction). 6/29 Renal Artery Doppler: Negative renal artery stenosis noted bilaterally.    VENTILATOR SETTINGS: None   Cultures None  Antimicrobials: None   Devices None   LINES / TUBES:  None    Continuous Infusions: . nitroGLYCERIN 30 mcg/min (01/07/17 0645)     Objective: Vitals:   01/07/17 0400 01/07/17 0403 01/07/17 0426 01/07/17 0615  BP: (!) 186/121 (!) 188/121 (!) 175/118 (!) 171/114  Pulse: (!) 102 (!) 102 92 99  Resp: 20 19 20 18   Temp:      TempSrc:      SpO2: 98% 98%  99%  Weight:  Height:        Intake/Output Summary (Last 24 hours) at 01/07/17 0814 Last data filed at 01/07/17 0645  Gross per 24 hour  Intake           436.06 ml  Output             1000 ml  Net          -563.94 ml   Filed Weights   01/05/17 0519 01/06/17 0622 01/07/17 0332  Weight: 185 lb 11.2 oz (84.2 kg) 190 lb 7.6 oz (86.4 kg) 186 lb (84.4 kg)    Examination:  General: A/O 4, No acute respiratory distress Eyes: negative scleral hemorrhage,  negative anisocoria, negative icterus ENT: Negative Runny nose, negative gingival bleeding, Neck:  Negative scars, masses, torticollis, lymphadenopathy, JVD Lungs: Clear to auscultation bilaterally without wheezes or crackles Cardiovascular: Regular rate and rhythm without murmur gallop or rub normal S1 and S2 Abdomen: negative abdominal pain, nondistended, positive soft, bowel sounds, no rebound, no ascites, no appreciable mass Extremities: No significant cyanosis, clubbing, or edema bilateral lower extremities Skin: Negative rashes, lesions, ulcers Psychiatric:  Negative depression, negative anxiety, negative fatigue, negative mania  Central nervous system:  Cranial nerves II through XII intact, tongue/uvula midline, all extremities muscle strength 5/5, sensation intact throughout, negative dysarthria, negative expressive aphasia, negative receptive aphasia.  .     Data Reviewed: Care during the described time interval was provided by me .  I have reviewed this patient's available data, including medical history, events of note, physical examination, and all test results as part of my evaluation. I have personally reviewed and interpreted all radiology studies.  CBC:  Recent Labs Lab 01/02/17 1428 01/03/17 0032 01/03/17 0419 01/04/17 0246 01/05/17 0242  WBC 9.6 10.2 10.6* 11.5* 7.7  HGB 12.2* 11.4* 10.9* 9.7* 10.2*  HCT 36.3* 34.5* 33.7* 30.6* 31.1*  MCV 85.6 86.0 87.1 88.2 85.7  PLT 428* 359 359 303 276   Basic Metabolic Panel:  Recent Labs Lab 01/03/17 0032 01/03/17 0419 01/04/17 0246 01/05/17 0242 01/06/17 1541 01/07/17 0243  NA  --  141 141 137 130* 137  K  --  3.3* 3.5 3.1* 3.3* 3.1*  CL  --  105 107 104 98* 102  CO2  --  26 26 24 23 25   GLUCOSE  --  277* 154* 250* 427* 203*  BUN  --  16 18 15 17 13   CREATININE  --  1.76* 2.13* 1.66* 1.51* 1.30*  CALCIUM  --  8.9 8.8* 8.4* 9.0 9.2  MG 1.3* 1.5*  --  2.2 1.8 1.7  PHOS  --  3.3  --   --   --   --     GFR: Estimated Creatinine Clearance: 64.1 mL/min (A) (by C-G formula based on SCr of 1.3 mg/dL (H)). Liver Function Tests:  Recent Labs Lab 01/02/17 1428 01/04/17 0246 01/05/17 0242  AST 27 16 20   ALT 14* 10* 18  ALKPHOS 81 57 64  BILITOT 1.1 0.9 0.8  PROT 8.9* 5.8* 5.6*  ALBUMIN 4.2 3.0* 2.6*   No results for input(s): LIPASE, AMYLASE in the last 168 hours. No results for input(s): AMMONIA in the last 168 hours. Coagulation Profile: No results for input(s): INR, PROTIME in the last 168 hours. Cardiac Enzymes:  Recent Labs Lab 01/03/17 0032 01/03/17 0419 01/03/17 1009 01/04/17 2301  TROPONINI 0.08* 0.08* 0.07* 0.05*   BNP (last 3 results) No results for input(s): PROBNP in the last 8760 hours. HbA1C:  Recent  Labs  01/06/17 1541  HGBA1C 8.0*   CBG:  Recent Labs Lab 01/05/17 1713 01/05/17 2045 01/06/17 1148 01/06/17 1653 01/06/17 2135  GLUCAP 240* 217* 268* 394* 141*   Lipid Profile:  Recent Labs  01/06/17 1541  CHOL 122  HDL 53  LDLCALC 48  TRIG 104  CHOLHDL 2.3   Thyroid Function Tests: No results for input(s): TSH, T4TOTAL, FREET4, T3FREE, THYROIDAB in the last 72 hours. Anemia Panel: No results for input(s): VITAMINB12, FOLATE, FERRITIN, TIBC, IRON, RETICCTPCT in the last 72 hours. Urine analysis:    Component Value Date/Time   COLORURINE STRAW (A) 01/02/2017 1853   APPEARANCEUR CLEAR 01/02/2017 1853   LABSPEC 1.016 01/02/2017 1853   PHURINE 5.0 01/02/2017 1853   GLUCOSEU >=500 (A) 01/02/2017 1853   HGBUR MODERATE (A) 01/02/2017 1853   BILIRUBINUR NEGATIVE 01/02/2017 1853   KETONESUR 20 (A) 01/02/2017 1853   PROTEINUR >=300 (A) 01/02/2017 1853   NITRITE NEGATIVE 01/02/2017 1853   LEUKOCYTESUR NEGATIVE 01/02/2017 1853   Sepsis Labs: @LABRCNTIP (procalcitonin:4,lacticidven:4)  ) Recent Results (from the past 240 hour(s))  Urine culture     Status: None   Collection Time: 01/02/17  6:53 PM  Result Value Ref Range Status    Specimen Description URINE, RANDOM  Final   Special Requests NONE  Final   Culture NO GROWTH  Final   Report Status 01/04/2017 FINAL  Final  Blood culture (routine x 2)     Status: None (Preliminary result)   Collection Time: 01/02/17  7:45 PM  Result Value Ref Range Status   Specimen Description BLOOD RIGHT HAND  Final   Special Requests   Final    BOTTLES DRAWN AEROBIC AND ANAEROBIC Blood Culture adequate volume   Culture NO GROWTH 4 DAYS  Final   Report Status PENDING  Incomplete  Blood culture (routine x 2)     Status: None (Preliminary result)   Collection Time: 01/02/17  7:50 PM  Result Value Ref Range Status   Specimen Description BLOOD LEFT HAND  Final   Special Requests   Final    BOTTLES DRAWN AEROBIC AND ANAEROBIC Blood Culture adequate volume   Culture NO GROWTH 4 DAYS  Final   Report Status PENDING  Incomplete  MRSA PCR Screening     Status: None   Collection Time: 01/03/17 12:13 AM  Result Value Ref Range Status   MRSA by PCR NEGATIVE NEGATIVE Final    Comment:        The GeneXpert MRSA Assay (FDA approved for NASAL specimens only), is one component of a comprehensive MRSA colonization surveillance program. It is not intended to diagnose MRSA infection nor to guide or monitor treatment for MRSA infections.          Radiology Studies: No results found.      Scheduled Meds: . cloNIDine  0.2 mg Transdermal Weekly  . feeding supplement  1 Container Oral BID BM  . insulin aspart  0-15 Units Subcutaneous TID WC  . insulin aspart  0-5 Units Subcutaneous QHS  . metoprolol tartrate  100 mg Oral BID  . pantoprazole (PROTONIX) IV  40 mg Intravenous Q12H  . sodium chloride flush  3 mL Intravenous Q12H   Continuous Infusions: . nitroGLYCERIN 30 mcg/min (01/07/17 0645)     LOS: 5 days    Time spent: 40 minutes    WOODS, Roselind Messier, MD [facility] Pager [phone]   If 7PM-7AM, please contact night-coverage [email or web address] Password  [email or web address] 01/07/2017, 8:14 AM

## 2017-01-08 DIAGNOSIS — E1121 Type 2 diabetes mellitus with diabetic nephropathy: Secondary | ICD-10-CM

## 2017-01-08 DIAGNOSIS — N179 Acute kidney failure, unspecified: Secondary | ICD-10-CM

## 2017-01-08 LAB — BASIC METABOLIC PANEL
Anion gap: 6 (ref 5–15)
BUN: 13 mg/dL (ref 6–20)
CALCIUM: 8.7 mg/dL — AB (ref 8.9–10.3)
CO2: 26 mmol/L (ref 22–32)
CREATININE: 1.31 mg/dL — AB (ref 0.61–1.24)
Chloride: 101 mmol/L (ref 101–111)
GFR calc Af Amer: >60 mL/min (ref >60)
GFR, EST NON AFRICAN AMERICAN: 55 mL/min — AB (ref >60)
Glucose, Bld: 196 mg/dL — ABNORMAL HIGH (ref 65–99)
Potassium: 3.3 mmol/L — ABNORMAL LOW (ref 3.5–5.1)
SODIUM: 133 mmol/L — AB (ref 135–145)

## 2017-01-08 LAB — GLUCOSE, CAPILLARY
Glucose-Capillary: 187 mg/dL — ABNORMAL HIGH (ref 65–99)
Glucose-Capillary: 193 mg/dL — ABNORMAL HIGH (ref 65–99)
Glucose-Capillary: 328 mg/dL — ABNORMAL HIGH (ref 65–99)
Glucose-Capillary: 317 mg/dL — ABNORMAL HIGH (ref 65–99)
Glucose-Capillary: 97 mg/dL (ref 65–99)

## 2017-01-08 LAB — MAGNESIUM: Magnesium: 1.8 mg/dL (ref 1.7–2.4)

## 2017-01-08 MED ORDER — PANTOPRAZOLE SODIUM 40 MG PO TBEC
40.0000 mg | DELAYED_RELEASE_TABLET | Freq: Two times a day (BID) | ORAL | Status: DC
  Administered 2017-01-08 – 2017-01-09 (×2): 40 mg via ORAL
  Filled 2017-01-08 (×2): qty 1

## 2017-01-08 MED ORDER — POTASSIUM CHLORIDE CRYS ER 20 MEQ PO TBCR
60.0000 meq | EXTENDED_RELEASE_TABLET | Freq: Once | ORAL | Status: AC
  Administered 2017-01-08: 60 meq via ORAL
  Filled 2017-01-08: qty 3

## 2017-01-08 MED ORDER — GLIPIZIDE 5 MG PO TABS
5.0000 mg | ORAL_TABLET | Freq: Two times a day (BID) | ORAL | Status: DC
  Administered 2017-01-08 – 2017-01-09 (×2): 5 mg via ORAL
  Filled 2017-01-08 (×4): qty 1

## 2017-01-08 MED ORDER — HYDRALAZINE HCL 20 MG/ML IJ SOLN
15.0000 mg | INTRAMUSCULAR | Status: DC | PRN
  Administered 2017-01-08: 15 mg via INTRAVENOUS
  Filled 2017-01-08: qty 1

## 2017-01-08 MED ORDER — LABETALOL HCL 5 MG/ML IV SOLN
15.0000 mg | Freq: Once | INTRAVENOUS | Status: AC
  Administered 2017-01-08: 15 mg via INTRAVENOUS
  Filled 2017-01-08: qty 4

## 2017-01-08 MED ORDER — ISOSORBIDE DINITRATE 10 MG PO TABS
30.0000 mg | ORAL_TABLET | Freq: Two times a day (BID) | ORAL | Status: DC
  Administered 2017-01-08 – 2017-01-09 (×3): 30 mg via ORAL
  Filled 2017-01-08 (×3): qty 3

## 2017-01-08 MED ORDER — MAGNESIUM CITRATE PO SOLN
0.5000 | Freq: Once | ORAL | Status: AC
  Administered 2017-01-08: 0.5 via ORAL
  Filled 2017-01-08: qty 296

## 2017-01-08 NOTE — Progress Notes (Signed)
Pt received hydralazine at 0322 for high BP. BP still 193/114. MD notified and ordered labetalol. Will continue to closely monitor BP.

## 2017-01-08 NOTE — Progress Notes (Signed)
PROGRESS NOTE    Brian LeatherwoodMelvin L Grimes  ZOX:[id number] DOB: 08/18/1950 DOA: 01/02/2017 PCP: System, Pcp Not In   Brief Narrative:  66 y.o.BM [facility] who had a f/u visit 4 days prior to this admit where he c/o generalized weakness and lightheadedness.  He was found to be hypotensive with BP 87/45 and his clonidine (at max dose) was abruptly discontinued (per his physician).  The day before his admit he developed intractable NV. Upon presentation to the ED he was found to have BP readings in the 200s/140s with HR 140s.   Subjective: 7/1  A/O 4, negative CP, negative SOB, negative nausea. Ambulating around ward without DOE, CP    Assessment & Plan:   Principal Problem:   Hypertensive urgency Active Problems:   CKD stage 3 due to type 2 diabetes mellitus (HCC)   Nausea & vomiting   Lactic acidosis   Hyperglycemia due to type 2 diabetes mellitus (HCC)   CAD (coronary artery disease), native coronary artery   HTN urgency -Difficult to control BP -Catapres 0.2 mg -6/30 increased Metoprolol 150 mg BID -7/1Increase Isosorbide dinitrate 30 mg BID -Will stay away from ACE I or ARB secondary to CKD -Simplifed patient's BP regimen while maintaining improved control.  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 -Negative CP -6/29 Hemoglobin A1c= 8.0 -Lipid panel: Within ADA guidelines  Chronic diastolic CHF -Strict I&O since admission +2.8 L -Daily weight -See hypertension urgency  Diabetes type 2 uncontrolled with renal complications/Hyperglycemia type 2 diabetes -6/29 Hemoglobin A1c= 8.0 -7/1 increase Glipizide 5 mg daily -May need to send patient home on low-dose Lantus -Moderate SSI  Coffee ground emesis / UGIB -Clinically seems most consistent with Mallory-Weiss tear or esophagitis/gastritis in setting of recurrent episodes of retching/vomiting  -Resolved -Monitor hemoglobin Recent Labs Lab 01/02/17 1428 01/03/17 0032 01/03/17 0419  01/04/17 0246 01/05/17 0242  HGB 12.2* 11.4* 10.9* 9.7* 10.2*  -Stable  Acute renal failure on CKD Stage 3(Baseline crt 1.5)  - suspect injury related to HTN  - follow trend w/ ongoing attempts to manage BP Lab Results  Component Value Date   CREATININE 1.31 (H) 01/08/2017   CREATININE 1.30 (H) 01/07/2017   CREATININE 1.51 (H) 01/06/2017  -At baseline   Hypokalemia -Potassium goal> 4 -K-Dur 60 meq   Hypomagnesemia -Magnesium goal>2      DVT prophylaxis: SCD Code Status: Full Family Communication: None  Disposition Plan: Discharge on 7/2   [facility]  [facility]   Procedures/Significant Events:  6/25 admit  6/27 TRH resumed care  6/28 Echocardiogram:Left ventricle:  mild LVH. -LVEF =60% to 65%. -(grade 1 diastolic dysfunction). 6/29 Renal Artery Doppler: Negative renal artery stenosis noted bilaterally.    VENTILATOR SETTINGS: None   Cultures None  Antimicrobials: None   Devices None   LINES / TUBES:  None    Continuous Infusions: . nitroGLYCERIN Stopped (01/07/17 1002)     Objective: Vitals:   01/08/17 0000 01/08/17 0322 01/08/17 0400 01/08/17 0811  BP: (!) 141/108 (!) 134/101 121/85 (!) 158/107  Pulse: 81 87 84 89  Resp: 20 16 13  (!) 21  Temp:  99 F (37.2 C)  98.7 F (37.1 C)  TempSrc:  Oral  Oral  SpO2: 100% 99% 99% 99%  Weight:  186 lb (84.4 kg)    Height:        Intake/Output Summary (Last 24 hours) at 01/08/17 0820 Last data filed at 01/08/17 0009  Gross per 24 hour  Intake                0 ml  Output              350 ml  Net             -350 ml   Filed Weights   01/06/17 0622 01/07/17 0332 01/08/17 0322  Weight: 190 lb 7.6 oz (86.4 kg) 186 lb (84.4 kg) 186 lb (84.4 kg)    Examination:  General: A/O 4, No acute respiratory distress Eyes: negative scleral hemorrhage, negative anisocoria, negative icterus ENT: Negative Runny nose, negative gingival bleeding, Neck:  Negative scars, masses, torticollis,  lymphadenopathy, JVD Lungs: Clear to auscultation bilaterally without wheezes or crackles Cardiovascular: Regular rate and rhythm without murmur gallop or rub normal S1 and S2 Abdomen: negative abdominal pain, nondistended, positive soft, bowel sounds, no rebound, no ascites, no appreciable mass Extremities: No significant cyanosis, clubbing, or edema bilateral lower extremities Skin: Negative rashes, lesions, ulcers Psychiatric:  Negative depression, negative anxiety, negative fatigue, negative mania  Central nervous system:  Cranial nerves II through XII intact, tongue/uvula midline, all extremities muscle strength 5/5, sensation intact throughout, negative dysarthria, negative expressive aphasia, negative receptive aphasia.  .     Data Reviewed: Care during the described time interval was provided by me .  I have reviewed this patient's available data, including medical history, events of note, physical examination, and all test results as part of my evaluation. I have personally reviewed and interpreted all radiology studies.  CBC:  Recent Labs Lab 01/02/17 1428 01/03/17 0032 01/03/17 0419 01/04/17 0246 01/05/17 0242  WBC 9.6 10.2 10.6* 11.5* 7.7  HGB 12.2* 11.4* 10.9* 9.7* 10.2*  HCT 36.3* 34.5* 33.7* 30.6* 31.1*  MCV 85.6 86.0 87.1 88.2 85.7  PLT 428* 359 359 303 276   Basic Metabolic Panel:  Recent Labs Lab 01/03/17 0419 01/04/17 0246 01/05/17 0242 01/06/17 1541 01/07/17 0243 01/08/17 0311  NA 141 141 137 130* 137 133*  K 3.3* 3.5 3.1* 3.3* 3.1* 3.3*  CL 105 107 104 98* 102 101  CO2 26 26 24 23 25 26   GLUCOSE 277* 154* 250* 427* 203* 196*  BUN 16 18 15 17 13 13   CREATININE 1.76* 2.13* 1.66* 1.51* 1.30* 1.31*  CALCIUM 8.9 8.8* 8.4* 9.0 9.2 8.7*  MG 1.5*  --  2.2 1.8 1.7 1.8  PHOS 3.3  --   --   --   --   --    GFR: Estimated Creatinine Clearance: 63.6 mL/min (A) (by C-G formula based on SCr of 1.31 mg/dL (H)). Liver Function Tests:  Recent Labs Lab  01/02/17 1428 01/04/17 0246 01/05/17 0242  AST 27 16 20   ALT 14* 10* 18  ALKPHOS 81 57 64  BILITOT 1.1 0.9 0.8  PROT 8.9* 5.8* 5.6*  ALBUMIN 4.2 3.0* 2.6*   No results for input(s): LIPASE, AMYLASE in the last 168 hours. No results for input(s): AMMONIA in the last 168 hours. Coagulation Profile: No results for input(s): INR, PROTIME in the last 168 hours. Cardiac Enzymes:  Recent Labs Lab 01/03/17 0032 01/03/17 0419 01/03/17 1009 01/04/17 2301  TROPONINI 0.08* 0.08* 0.07* 0.05*   BNP (last 3 results) No results for input(s): PROBNP in the last 8760 hours. HbA1C:  Recent Labs  01/06/17 1541  HGBA1C 8.0*   CBG:  Recent Labs Lab 01/06/17 2135 01/07/17 1241 01/07/17 1717 01/07/17 2029 01/08/17 0330  GLUCAP 141* 308* 312* 311* 187*   Lipid Profile:  Recent  Labs  01/06/17 1541  CHOL 122  HDL 53  LDLCALC 48  TRIG 104  CHOLHDL 2.3   Thyroid Function Tests: No results for input(s): TSH, T4TOTAL, FREET4, T3FREE, THYROIDAB in the last 72 hours. Anemia Panel: No results for input(s): VITAMINB12, FOLATE, FERRITIN, TIBC, IRON, RETICCTPCT in the last 72 hours. Urine analysis:    Component Value Date/Time   COLORURINE STRAW (A) 01/02/2017 1853   APPEARANCEUR CLEAR 01/02/2017 1853   LABSPEC 1.016 01/02/2017 1853   PHURINE 5.0 01/02/2017 1853   GLUCOSEU >=500 (A) 01/02/2017 1853   HGBUR MODERATE (A) 01/02/2017 1853   BILIRUBINUR NEGATIVE 01/02/2017 1853   KETONESUR 20 (A) 01/02/2017 1853   PROTEINUR >=300 (A) 01/02/2017 1853   NITRITE NEGATIVE 01/02/2017 1853   LEUKOCYTESUR NEGATIVE 01/02/2017 1853   Sepsis Labs: @LABRCNTIP (procalcitonin:4,lacticidven:4)  ) Recent Results (from the past 240 hour(s))  Urine culture     Status: None   Collection Time: 01/02/17  6:53 PM  Result Value Ref Range Status   Specimen Description URINE, RANDOM  Final   Special Requests NONE  Final   Culture NO GROWTH  Final   Report Status 01/04/2017 FINAL  Final  Blood  culture (routine x 2)     Status: None   Collection Time: 01/02/17  7:45 PM  Result Value Ref Range Status   Specimen Description BLOOD RIGHT HAND  Final   Special Requests   Final    BOTTLES DRAWN AEROBIC AND ANAEROBIC Blood Culture adequate volume   Culture NO GROWTH 5 DAYS  Final   Report Status 01/07/2017 FINAL  Final  Blood culture (routine x 2)     Status: None   Collection Time: 01/02/17  7:50 PM  Result Value Ref Range Status   Specimen Description BLOOD LEFT HAND  Final   Special Requests   Final    BOTTLES DRAWN AEROBIC AND ANAEROBIC Blood Culture adequate volume   Culture NO GROWTH 5 DAYS  Final   Report Status 01/07/2017 FINAL  Final  MRSA PCR Screening     Status: None   Collection Time: 01/03/17 12:13 AM  Result Value Ref Range Status   MRSA by PCR NEGATIVE NEGATIVE Final    Comment:        The GeneXpert MRSA Assay (FDA approved for NASAL specimens only), is one component of a comprehensive MRSA colonization surveillance program. It is not intended to diagnose MRSA infection nor to guide or monitor treatment for MRSA infections.          Radiology Studies: No results found.      Scheduled Meds: . cloNIDine  0.2 mg Transdermal Weekly  . feeding supplement  1 Container Oral BID BM  . glipiZIDE  5 mg Oral QAC breakfast  . insulin aspart  0-15 Units Subcutaneous TID WC  . insulin aspart  0-5 Units Subcutaneous QHS  . isosorbide dinitrate  20 mg Oral BID  . metoprolol tartrate  150 mg Oral BID  . pantoprazole (PROTONIX) IV  40 mg Intravenous Q12H  . sodium chloride flush  3 mL Intravenous Q12H   Continuous Infusions: . nitroGLYCERIN Stopped (01/07/17 1002)     LOS: 6 days    Time spent: 40 minutes    Averey Trompeter, Roselind Messier, MD [facility] Pager [phone]   If 7PM-7AM, please contact night-coverage [email or web address] Password [email or web address] 01/08/2017, 8:20 AM

## 2017-01-09 LAB — BASIC METABOLIC PANEL
ANION GAP: 8 (ref 5–15)
BUN: 13 mg/dL (ref 6–20)
CO2: 25 mmol/L (ref 22–32)
Calcium: 9 mg/dL (ref 8.9–10.3)
Chloride: 102 mmol/L (ref 101–111)
Creatinine, Ser: 1.4 mg/dL — ABNORMAL HIGH (ref 0.61–1.24)
GFR, EST AFRICAN AMERICAN: 59 mL/min — AB (ref >60)
GFR, EST NON AFRICAN AMERICAN: 50 mL/min — AB (ref >60)
Glucose, Bld: 143 mg/dL — ABNORMAL HIGH (ref 65–99)
Potassium: 3.5 mmol/L (ref 3.5–5.1)
SODIUM: 135 mmol/L (ref 135–145)

## 2017-01-09 LAB — GLUCOSE, CAPILLARY
Glucose-Capillary: 123 mg/dL — ABNORMAL HIGH (ref 65–99)
Glucose-Capillary: 239 mg/dL — ABNORMAL HIGH (ref 65–99)

## 2017-01-09 LAB — MAGNESIUM: MAGNESIUM: 1.8 mg/dL (ref 1.7–2.4)

## 2017-01-09 MED ORDER — ISOSORBIDE DINITRATE 30 MG PO TABS: 30.0000 mg | ORAL_TABLET | Freq: Two times a day (BID) | ORAL | 0 refills | Status: AC

## 2017-01-09 MED ORDER — METOPROLOL TARTRATE 75 MG PO TABS: 150.0000 mg | ORAL_TABLET | Freq: Two times a day (BID) | ORAL | 0 refills | Status: AC

## 2017-01-09 MED ORDER — CLONIDINE HCL 0.2 MG/24HR TD PTWK: 0.2000 mg | MEDICATED_PATCH | TRANSDERMAL | 0 refills | Status: AC

## 2017-01-09 MED ORDER — GLUCERNA SHAKE PO LIQD
237.0000 mL | Freq: Two times a day (BID) | ORAL | Status: DC
  Administered 2017-01-09: 237 mL via ORAL

## 2017-01-09 NOTE — Care Management Note (Signed)
Case Management Note  Patient Details  Name: Brian LeatherwoodMelvin L Moffa MRN: [id number] Date of Birth: 06/12/1951  Subjective/Objective:   Pt admitted with rebound hypertension from stopping BP meds per CVTS outpt office visit                 Action/Plan:   PTA from home with wife.  [facility]     Expected Discharge Date:  01/09/17               Expected Discharge Plan:  Home/Self Care  In-House Referral:     Discharge planning Services  CM Consult  Post Acute Care Choice:    Choice offered to:     DME Arranged:    DME Agency:     HH Arranged:    HH Agency:     Status of Service:     If discussed at [employer] Meetings, dates discussed:    Additional Comments: 01/09/2017 Pt to discharge home today - no CM needs determined prior to discharge Cherylann ParrClaxton, Kaeley Vinje S, RN 01/09/2017, 12:12 PM

## 2017-01-09 NOTE — Progress Notes (Signed)
Discharge instructions given and pt's questions answered. Pt verbalized understanding. Pt disconnected from the monitor and IV's removed. Tolerated well. No s/s of distress at this time. Prescriptions given with pt.

## 2017-01-09 NOTE — Progress Notes (Signed)
PT Cancellation Note  Patient Details Name: Brian LeatherwoodMelvin L Palencia MRN: [id number] DOB: 12/10/1950   Cancelled Treatment:    Reason Eval/Treat Not Completed: Patient declined, no reason specified Pt not in a great mood due to still being in the hospital. Declined working with PT, "I can move just fine, I want to get out of here," Will follow up.   Blake DivineShauna A Jeilyn Reznik 01/09/2017, 11:55 AM Mylo RedShauna Loucinda Croy, PT, DPT [phone]

## 2017-01-09 NOTE — Progress Notes (Signed)
Brief Nutrition Follow-Up Note  Chart reviewed. Pt has now been advanced to a heart healthy/ carb modified diet. Pt remain with good appetite; meal completion 100%.   Per nursing notes, requesting modifying supplement orders due to elevated CBGS. Pt was order Breeze supplement initially due to being on a clear liquid diet. Will change to Glucerna supplement BID, now that diet has been advanced.   RD will continue to follow for acute nutrition issues.   Joseangel Nettleton A. Mayford KnifeWilliams, RD, LDN, CDE Pager: [phone] After hours Pager: [phone]

## 2017-01-09 NOTE — Discharge Instructions (Signed)
Managing Your Hypertension Hypertension is commonly called high blood pressure. This is when the force of your blood pressing against the walls of your arteries is too strong. Arteries are blood vessels that carry blood from your heart throughout your body. Hypertension forces the heart to work harder to pump blood, and may cause the arteries to become narrow or stiff. Having untreated or uncontrolled hypertension can cause heart attack, stroke, kidney disease, and other problems. What are blood pressure readings? A blood pressure reading consists of a higher number over a lower number. Ideally, your blood pressure should be below 120/80. The first ("top") number is called the systolic pressure. It is a measure of the pressure in your arteries as your heart beats. The second ("bottom") number is called the diastolic pressure. It is a measure of the pressure in your arteries as the heart relaxes. What does my blood pressure reading mean? Blood pressure is classified into four stages. Based on your blood pressure reading, your health care provider may use the following stages to determine what type of treatment you need, if any. Systolic pressure and diastolic pressure are measured in a unit called mm Hg. Normal  Systolic pressure: below 120.  Diastolic pressure: below 80. Elevated  Systolic pressure: 120-129.  Diastolic pressure: below 80. Hypertension stage 1  Systolic pressure: 130-139.  Diastolic pressure: 80-89. Hypertension stage 2  Systolic pressure: 140 or above.  Diastolic pressure: 90 or above. What health risks are associated with hypertension? Managing your hypertension is an important responsibility. Uncontrolled hypertension can lead to:  A heart attack.  A stroke.  A weakened blood vessel (aneurysm).  Heart failure.  Kidney damage.  Eye damage.  Metabolic syndrome.  Memory and concentration problems.  What changes can I make to manage my  hypertension? Hypertension can be managed by making lifestyle changes and possibly by taking medicines. Your health care provider will help you make a plan to bring your blood pressure within a normal range. Eating and drinking  Eat a diet that is high in fiber and potassium, and low in salt (sodium), added sugar, and fat. An example eating plan is called the DASH (Dietary Approaches to Stop Hypertension) diet. To eat this way: ? Eat plenty of fresh fruits and vegetables. Try to fill half of your plate at each meal with fruits and vegetables. ? Eat whole grains, such as whole wheat pasta, brown rice, or whole grain bread. Fill about one quarter of your plate with whole grains. ? Eat low-fat diary products. ? Avoid fatty cuts of meat, processed or cured meats, and poultry with skin. Fill about one quarter of your plate with lean proteins such as fish, chicken without skin, beans, eggs, and tofu. ? Avoid premade and processed foods. These tend to be higher in sodium, added sugar, and fat.  Reduce your daily sodium intake. Most people with hypertension should eat less than 1,500 mg of sodium a day.  Limit alcohol intake to no more than 1 drink a day for nonpregnant women and 2 drinks a day for men. One drink equals 12 oz of beer, 5 oz of wine, or 1 oz of hard liquor. Lifestyle  Work with your health care provider to maintain a healthy body weight, or to lose weight. Ask what an ideal weight is for you.  Get at least 30 minutes of exercise that causes your heart to beat faster (aerobic exercise) most days of the week. Activities may include walking, swimming, or biking.  Include exercise   to strengthen your muscles (resistance exercise), such as weight lifting, as part of your weekly exercise routine. Try to do these types of exercises for 30 minutes at least 3 days a week.  Do not use any products that contain nicotine or tobacco, such as cigarettes and e-cigarettes. If you need help quitting, ask  your health care provider.  Control any long-term (chronic) conditions you have, such as high cholesterol or diabetes. Monitoring  Monitor your blood pressure at home as told by your health care provider. Your personal target blood pressure may vary depending on your medical conditions, your age, and other factors.  Have your blood pressure checked regularly, as often as told by your health care provider. Working with your health care provider  Review all the medicines you take with your health care provider because there may be side effects or interactions.  Talk with your health care provider about your diet, exercise habits, and other lifestyle factors that may be contributing to hypertension.  Visit your health care provider regularly. Your health care provider can help you create and adjust your plan for managing hypertension. Will I need medicine to control my blood pressure? Your health care provider may prescribe medicine if lifestyle changes are not enough to get your blood pressure under control, and if:  Your systolic blood pressure is 130 or higher.  Your diastolic blood pressure is 80 or higher.  Take medicines only as told by your health care provider. Follow the directions carefully. Blood pressure medicines must be taken as prescribed. The medicine does not work as well when you skip doses. Skipping doses also puts you at risk for problems. Contact a health care provider if:  You think you are having a reaction to medicines you have taken.  You have repeated (recurrent) headaches.  You feel dizzy.  You have swelling in your ankles.  You have trouble with your vision. Get help right away if:  You develop a severe headache or confusion.  You have unusual weakness or numbness, or you feel faint.  You have severe pain in your chest or abdomen.  You vomit repeatedly.  You have trouble breathing. Summary  Hypertension is when the force of blood pumping through  your arteries is too strong. If this condition is not controlled, it may put you at risk for serious complications.  Your personal target blood pressure may vary depending on your medical conditions, your age, and other factors. For most people, a normal blood pressure is less than 120/80.  Hypertension is managed by lifestyle changes, medicines, or both. Lifestyle changes include weight loss, eating a healthy, low-sodium diet, exercising more, and limiting alcohol. This information is not intended to replace advice given to you by your health care provider. Make sure you discuss any questions you have with your health care provider. Document Released: 03/21/2012 Document Revised: 05/25/2016 Document Reviewed: 05/25/2016 Elsevier Interactive Patient Education  2018 [employer]  

## 2017-01-09 NOTE — Discharge Summary (Signed)
DISCHARGE SUMMARY  Brian Grimes  MR#: [id number]  DOB:09/20/1950  Date of Admission: 01/02/2017 Date of Discharge: 01/09/2017  Attending Physician:Ferry Matthis T  Patient's PCP: [facility]  Consults:  None  Disposition: Discharge home  Follow-up Appts: Follow-up Information    [facility] Follow up.   Why:  See your primary care doctor at the [facility] within 7 days of your discharge to have your blood pressure and diabetes checked.            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  He was found to be hypotensive with BP 87/45 and his clonidine (at max dose) was abruptly discontinued.  The day before his admit he developed intractable NV. Upon presentation to the ED he was found to have BP readings in the 200s/140s with HR 140s.  Hospital Course:  HTN urgency Blood pressure has proven quite difficult to control - extreme caution should be utilized in adjusting his medications with recognition that abrupt discontinuation of clonidine or beta blockers is not advisable - the patient is counseled that further titration of his blood pressure medicines in the outpatient setting will be required and that close follow-up with his PCP is a must  Coffee ground emesis / UGIB Clinically most consistent with Mallory-Weiss tear or esophagitis/gastritis in setting of recurrent episodes of retching/vomiting - intractable nausea appeared to be driven by  his extreme hypertension - with improvement in blood pressure control his nausea/vomiting resolved and he had no further coffee ground emesis or evidence of ongoing GI bleeding   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 Denied SSCP during this hospital stay - no apparent acute complications  Acute kidney injury on CKD Stage 3 Baseline crt 1.5 - suspect injury related to HTN - creatinine returned to his baseline prior to discharge  Recent Labs Lab 01/05/17 0242 01/06/17 1541 01/07/17 0243 01/08/17 0311 01/09/17 0323  CREATININE 1.66* 1.51* 1.30* 1.31* 1.40*    Hypomagnesemia Replaced  DM CBG variable but not at goal consistently - outpatient follow-up CBGs will be indicated with probable need to further titrate his medical therapy   Allergies as of 01/09/2017      Reactions   Lipitor [atorvastatin] Other (See Comments)   Muscle aches (legs)   Pollen Extract Other (See Comments)   Runny nose, runny eyes, sneezing, etc..   Pravastatin Other (See Comments)   Muscle aches (legs)      Medication List    STOP taking these medications   lisinopril 5 MG tablet Commonly known as:  PRINIVIL,ZESTRIL   loratadine 10 MG tablet Commonly known as:  CLARITIN     TAKE these medications   cloNIDine 0.2 mg/24hr patch Commonly known as:  CATAPRES - Dosed in mg/24 hr Place 1 patch (0.2 mg total) onto the skin once a week. Start taking on:  01/11/2017   clopidogrel 75 MG tablet Commonly known as:  PLAVIX Take 75 mg by mouth daily.   FLONASE ALLERGY RELIEF 50 MCG/ACT nasal spray Generic drug:  fluticasone Place 1 spray into both nostrils daily as needed for allergies or rhinitis.   glipiZIDE 5 MG tablet Commonly known as:  GLUCOTROL Take 2.5 mg by mouth daily before breakfast.   hydrALAZINE 50 MG tablet Commonly known as:  APRESOLINE Take 50 mg by mouth every 8 (eight) hours.   isosorbide dinitrate 30 MG tablet Commonly known as:  ISORDIL Take 1 tablet (30 mg total) by mouth 2 (two)  times daily.   metFORMIN 1000 MG tablet Commonly known as:  GLUCOPHAGE Take 1,000 mg by mouth 2 (two) times daily with a meal.   Metoprolol Tartrate 75 MG Tabs Take 150 mg by mouth 2 (two) times daily. What changed:  medication strength  how much to take   sennosides-docusate sodium 8.6-50 MG tablet Commonly known as:  SENOKOT-S Take 2 tablets by mouth at bedtime as needed for constipation.       Day of Discharge BP (!) 162/109   Pulse 95   Temp 98.4 F (36.9 C) (Oral)   Resp 12   Ht 6\' 2"  (1.88 m)   Wt 86.1 kg (189 lb 12.8 oz)   SpO2 99%   BMI 24.37 kg/m   Physical Exam: General: No acute respiratory distress Lungs: Clear to auscultation bilaterally without wheezes or crackles Cardiovascular: Regular rate and rhythm without murmur gallop or rub normal S1 and S2 Abdomen: Nontender, nondistended, soft, bowel sounds positive, no rebound, no ascites, no appreciable mass Extremities: No significant cyanosis, clubbing, or edema bilateral lower extremities  Basic Metabolic Panel:  Recent Labs Lab 01/03/17 0419  01/05/17 0242 01/06/17 1541 01/07/17 0243 01/08/17 0311 01/09/17 0323  NA 141  < > 137 130* 137 133* 135  K 3.3*  < > 3.1* 3.3* 3.1* 3.3* 3.5  CL 105  < > 104 98* 102 101 102  CO2 26  < > 24 23 25 26 25   GLUCOSE 277*  < > 250* 427* 203* 196* 143*  BUN 16  < > 15 17 13 13 13   CREATININE 1.76*  < > 1.66* 1.51* 1.30* 1.31* 1.40*  CALCIUM 8.9  < > 8.4* 9.0 9.2 8.7* 9.0  MG 1.5*  --  2.2 1.8 1.7 1.8 1.8  PHOS 3.3  --   --   --   --   --   --   < > = values in this interval not displayed.  Liver Function Tests:  Recent Labs Lab 01/02/17 1428 01/04/17 0246 01/05/17 0242  AST 27 16 20   ALT 14* 10* 18  ALKPHOS 81 57 64  BILITOT 1.1 0.9 0.8  PROT 8.9* 5.8* 5.6*  ALBUMIN 4.2 3.0* 2.6*    CBC:  Recent Labs Lab 01/02/17 1428 01/03/17 0032 01/03/17 0419 01/04/17 0246 01/05/17 0242  WBC 9.6 10.2 10.6* 11.5* 7.7  HGB 12.2* 11.4* 10.9* 9.7* 10.2*   HCT 36.3* 34.5* 33.7* 30.6* 31.1*  MCV 85.6 86.0 87.1 88.2 85.7  PLT 428* 359 359 303 276    Cardiac Enzymes:  Recent Labs Lab 01/03/17 0032 01/03/17 0419 01/03/17 1009 01/04/17 2301  TROPONINI 0.08* 0.08* 0.07* 0.05*    CBG:  Recent Labs Lab 01/08/17 1225 01/08/17 1650 01/08/17 2154 01/09/17 0822 01/09/17 1129  GLUCAP 328* 317* 97 123* 239*    Recent Results (from the past 240 hour(s))  Urine culture     Status: None   Collection Time: 01/02/17  6:53 PM  Result Value Ref Range Status   Specimen Description URINE, RANDOM  Final   Special  Requests NONE  Final   Culture NO GROWTH  Final   Report Status 01/04/2017 FINAL  Final  Blood culture (routine x 2)     Status: None   Collection Time: 01/02/17  7:45 PM  Result Value Ref Range Status   Specimen Description BLOOD RIGHT HAND  Final   Special Requests   Final    BOTTLES DRAWN AEROBIC AND ANAEROBIC Blood Culture adequate volume   Culture NO GROWTH 5 DAYS  Final   Report Status 01/07/2017 FINAL  Final  Blood culture (routine x 2)     Status: None   Collection Time: 01/02/17  7:50 PM  Result Value Ref Range Status   Specimen Description BLOOD LEFT HAND  Final   Special Requests   Final    BOTTLES DRAWN AEROBIC AND ANAEROBIC Blood Culture adequate volume   Culture NO GROWTH 5 DAYS  Final   Report Status 01/07/2017 FINAL  Final  MRSA PCR Screening     Status: None   Collection Time: 01/03/17 12:13 AM  Result Value Ref Range Status   MRSA by PCR NEGATIVE NEGATIVE Final    Comment:        The GeneXpert MRSA Assay (FDA approved for NASAL specimens only), is one component of a comprehensive MRSA colonization surveillance program. It is not intended to diagnose MRSA infection nor to guide or monitor treatment for MRSA infections.      Time spent in discharge (includes decision making & examination of pt): 35 minutes  01/09/2017, 12:12 PM   Lonia Blood, MD [facility] Office   [phone] Pager [phone]  On-Call/Text Page:      [email or web address]      password [email or web address]

## 2019-01-30 IMAGING — DX DG CHEST 1V PORT
2 series · 2 of 2 positions shown · non-contrast
Comparison: None in PACs

CLINICAL DATA: Two days of nausea and vomiting. Tachycardia.
History of diabetes and coronary artery disease. CABG in November 2016.

EXAM:
PORTABLE CHEST 1 VIEW

[chest ap (1 of 2)]
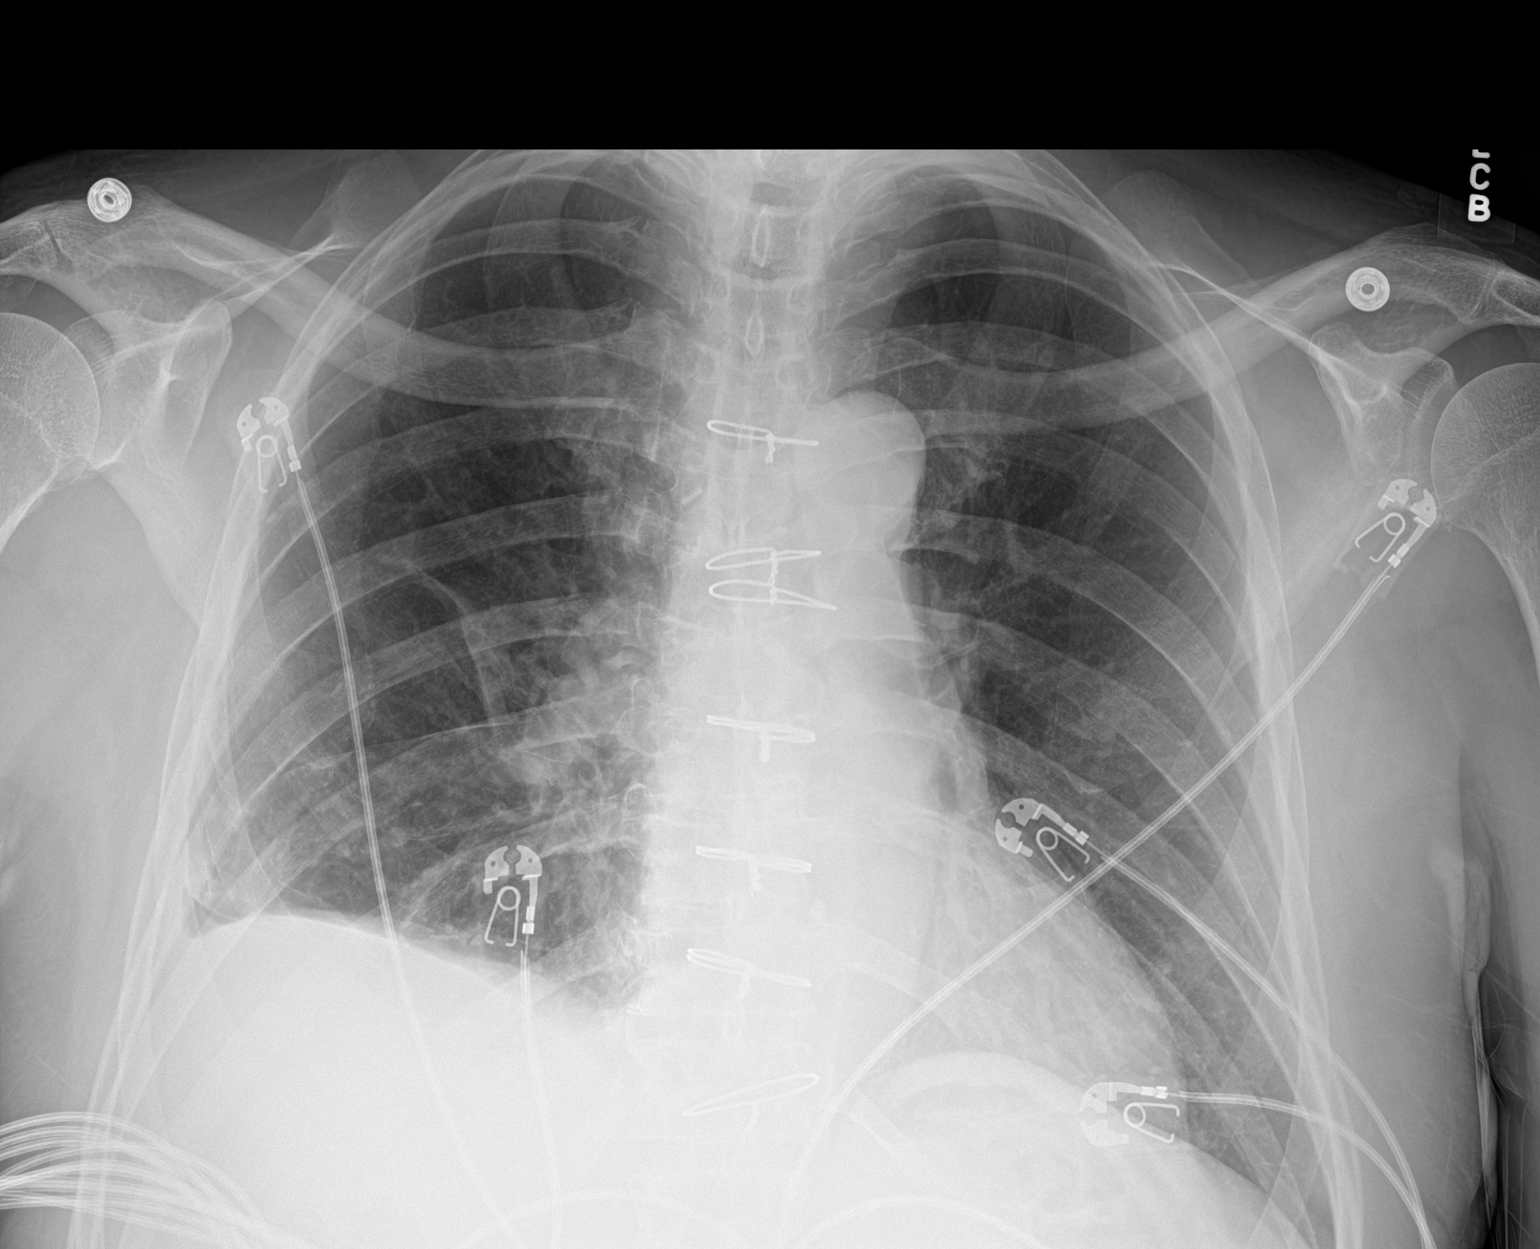

[chest ap (2 of 2)]
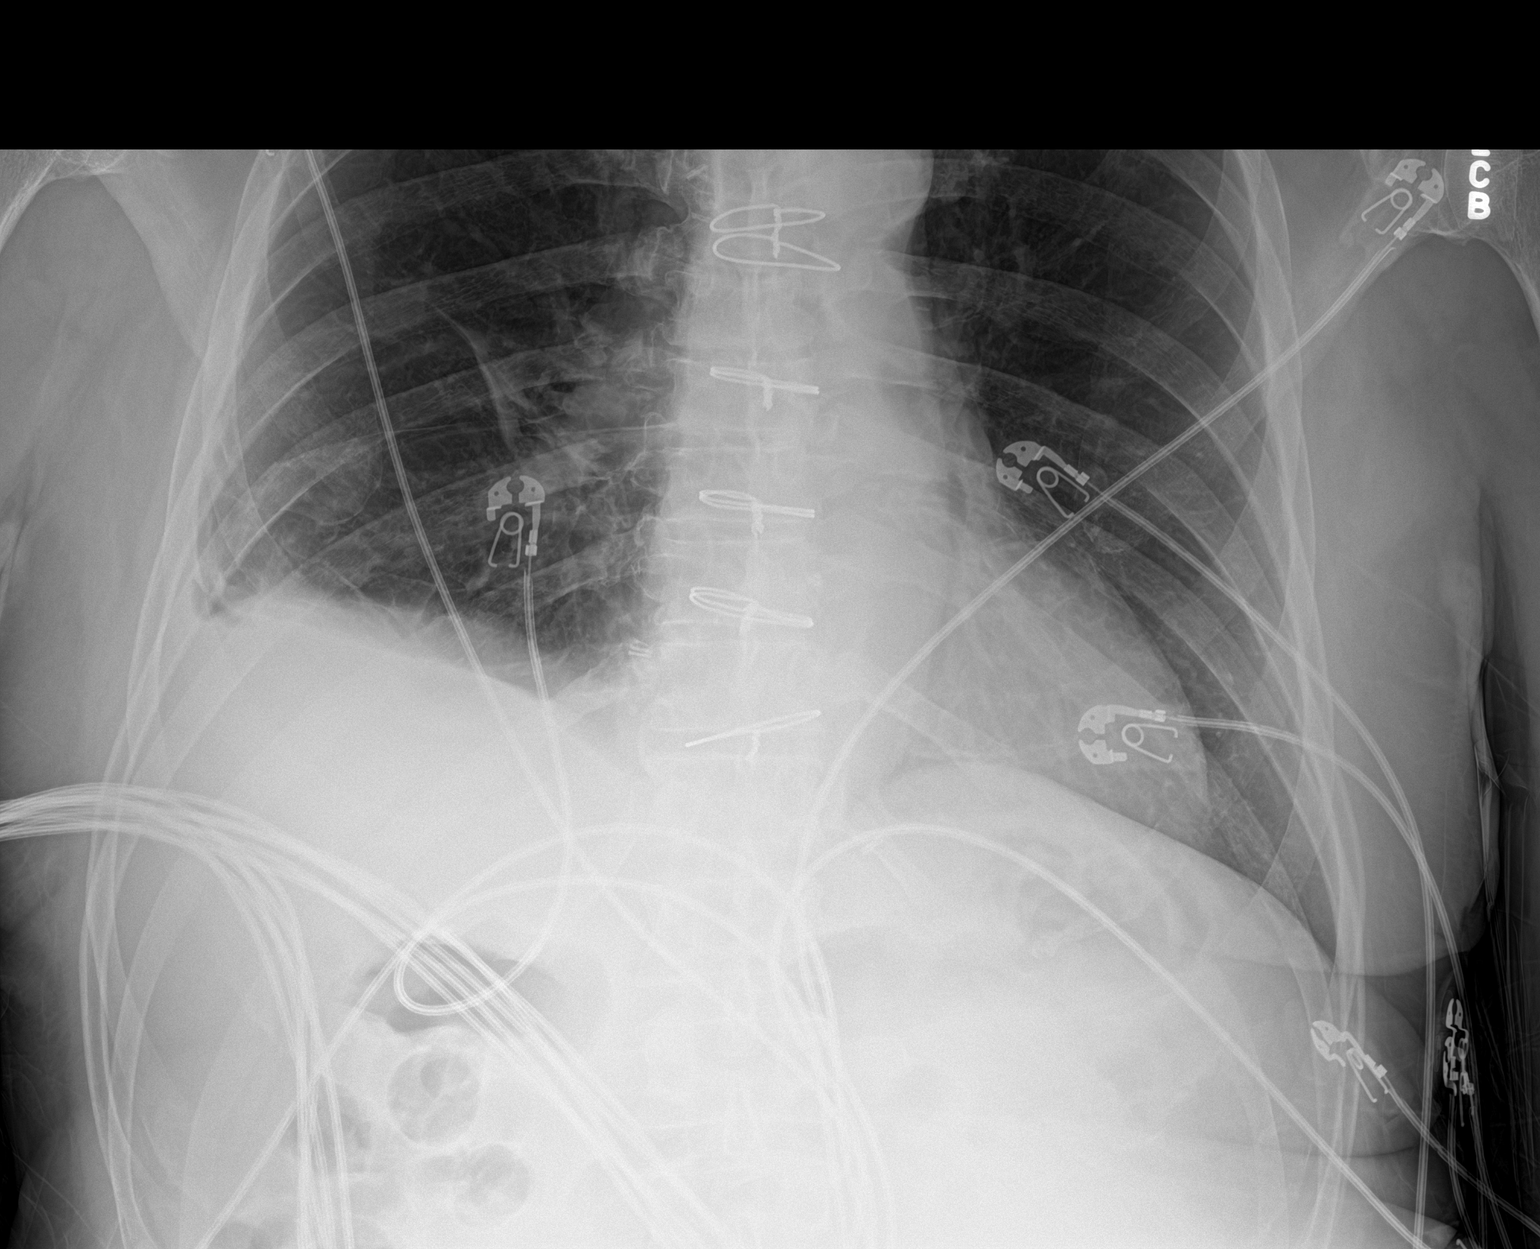

[2 of 2 positions shown; findings below may reference images not displayed]

FINDINGS: The left lung is well-expanded and clear. On the right there is mild
volume loss. Linear increased density in the right perihilar and
infrahilar region is present. There is blunting of the right lateral
costophrenic angle. The heart and pulmonary vascularity are normal.
There is calcification in the wall of the aortic arch. The sternal
wires are intact.
IMPRESSION: Right perihilar and basilar subsegmental atelectasis. Small right
pleural effusion versus pleural thickening. No pulmonary edema.
Previous CABG.

Thoracic aortic atherosclerosis.

## 2019-01-31 IMAGING — DX DG ABD PORTABLE 1V
1 series · 1 of 1 positions shown · non-contrast
Comparison: No comparison abdominal exam. Comparison chest x-ray
01/02/2017.

CLINICAL DATA: 65-year-old male with nausea.  Initial encounter.

EXAM:
PORTABLE ABDOMEN - 1 VIEW

[abdomen kub]
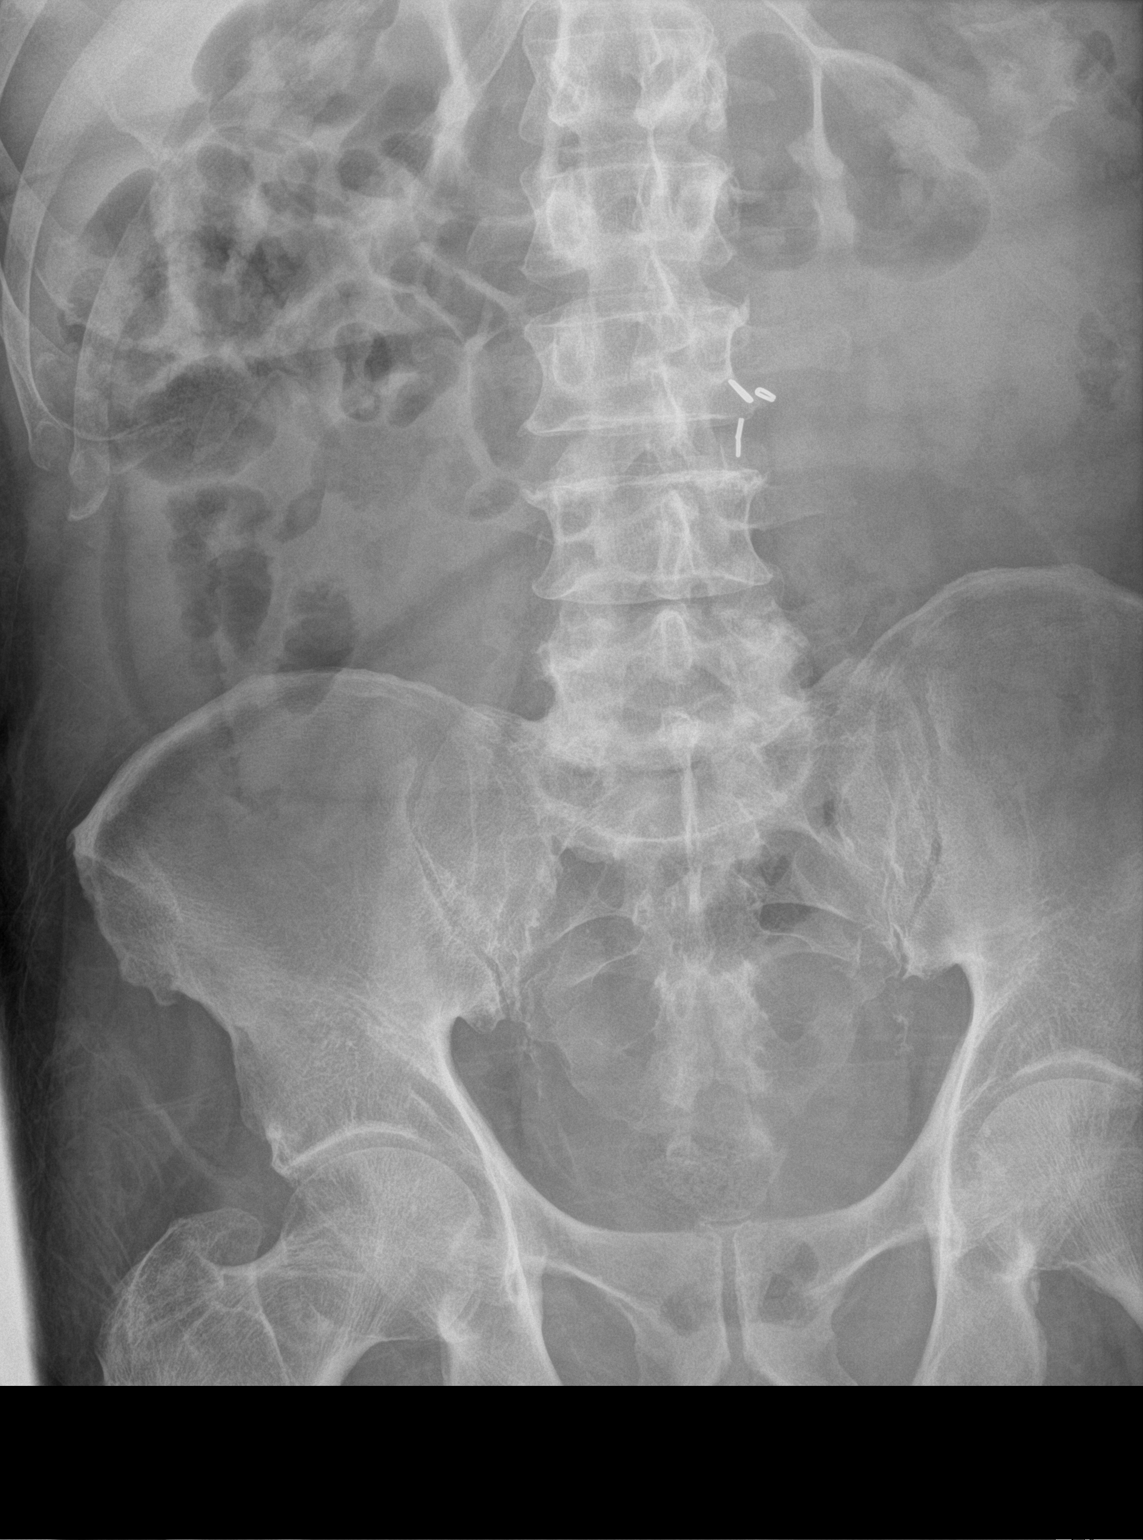

[1 of 1 positions shown; findings below may reference images not displayed]

FINDINGS: No plain film evidence of bowel obstruction.

The possibility of free intraperitoneal air cannot be assessed on a
supine view.

Surgical clips left aspect of the L3-4 disc space level.
IMPRESSION: No plain film evidence of bowel obstruction.
